# Patient Record
Sex: Male | Born: 1955 | Race: White | Hispanic: No | Smoking: Current every day smoker
Health system: Southern US, Community
[De-identification: ages and names within clinical notes are randomized; demographics above are authoritative.]

## PROBLEM LIST (undated history)

## (undated) DIAGNOSIS — J189 Pneumonia, unspecified organism: Secondary | ICD-10-CM

## (undated) DIAGNOSIS — J45909 Unspecified asthma, uncomplicated: Secondary | ICD-10-CM

## (undated) DIAGNOSIS — E119 Type 2 diabetes mellitus without complications: Secondary | ICD-10-CM

## (undated) DIAGNOSIS — I609 Nontraumatic subarachnoid hemorrhage, unspecified: Secondary | ICD-10-CM

---

## 1997-09-16 ENCOUNTER — Emergency Department (HOSPITAL_COMMUNITY): Admission: EM | Admit: 1997-09-16 | Discharge: 1997-09-16 | Payer: Self-pay | Admitting: Emergency Medicine

## 2005-05-12 ENCOUNTER — Emergency Department (HOSPITAL_COMMUNITY): Admission: EM | Admit: 2005-05-12 | Discharge: 2005-05-12 | Payer: Self-pay | Admitting: Emergency Medicine

## 2017-03-30 ENCOUNTER — Other Ambulatory Visit: Payer: Self-pay | Admitting: Family Medicine

## 2017-03-30 ENCOUNTER — Ambulatory Visit
Admission: RE | Admit: 2017-03-30 | Discharge: 2017-03-30 | Disposition: A | Discharge status: Home / Self Care | Payer: 59 | Source: Ambulatory Visit | Attending: Family Medicine | Admitting: Family Medicine

## 2017-03-30 ENCOUNTER — Other Ambulatory Visit: Payer: Self-pay

## 2017-03-30 DIAGNOSIS — R059 Cough, unspecified: Secondary | ICD-10-CM

## 2017-03-30 DIAGNOSIS — R05 Cough: Secondary | ICD-10-CM

## 2018-03-06 ENCOUNTER — Inpatient Hospital Stay (HOSPITAL_COMMUNITY)
Admission: EM | Admit: 2018-03-06 | Discharge: 2018-03-17 | DRG: 022 | Disposition: A | Discharge status: Home / Self Care | Payer: 59 | Attending: Neurosurgery | Admitting: Neurosurgery

## 2018-03-06 ENCOUNTER — Inpatient Hospital Stay (HOSPITAL_COMMUNITY): Payer: 59 | Admitting: Anesthesiology

## 2018-03-06 ENCOUNTER — Inpatient Hospital Stay (HOSPITAL_COMMUNITY): Payer: 59

## 2018-03-06 ENCOUNTER — Encounter (HOSPITAL_COMMUNITY)
Admission: EM | Disposition: A | Discharge status: Home / Self Care | Payer: Self-pay | Source: Home / Self Care | Attending: Neurosurgery

## 2018-03-06 ENCOUNTER — Encounter (HOSPITAL_COMMUNITY): Payer: Self-pay | Admitting: Certified Registered Nurse Anesthetist

## 2018-03-06 ENCOUNTER — Encounter (HOSPITAL_COMMUNITY): Payer: Self-pay | Admitting: *Deleted

## 2018-03-06 ENCOUNTER — Other Ambulatory Visit: Payer: Self-pay

## 2018-03-06 ENCOUNTER — Emergency Department (HOSPITAL_COMMUNITY): Payer: 59

## 2018-03-06 DIAGNOSIS — I609 Nontraumatic subarachnoid hemorrhage, unspecified: Secondary | ICD-10-CM | POA: Diagnosis not present

## 2018-03-06 DIAGNOSIS — Z880 Allergy status to penicillin: Secondary | ICD-10-CM | POA: Diagnosis not present

## 2018-03-06 DIAGNOSIS — F17200 Nicotine dependence, unspecified, uncomplicated: Secondary | ICD-10-CM | POA: Diagnosis present

## 2018-03-06 DIAGNOSIS — I671 Cerebral aneurysm, nonruptured: Secondary | ICD-10-CM | POA: Diagnosis not present

## 2018-03-06 DIAGNOSIS — Z7982 Long term (current) use of aspirin: Secondary | ICD-10-CM | POA: Diagnosis not present

## 2018-03-06 DIAGNOSIS — Z79899 Other long term (current) drug therapy: Secondary | ICD-10-CM | POA: Diagnosis not present

## 2018-03-06 HISTORY — PX: IR ANGIO INTRA EXTRACRAN SEL INTERNAL CAROTID BILAT MOD SED: IMG5363

## 2018-03-06 HISTORY — PX: IR TRANSCATH/EMBOLIZ: IMG695

## 2018-03-06 HISTORY — PX: IR ANGIOGRAM FOLLOW UP STUDY: IMG697

## 2018-03-06 HISTORY — PX: IR NEURO EACH ADD'L AFTER BASIC UNI LEFT (MS): IMG5373

## 2018-03-06 HISTORY — PX: IR ANGIO VERTEBRAL SEL VERTEBRAL BILAT MOD SED: IMG5369

## 2018-03-06 HISTORY — PX: RADIOLOGY WITH ANESTHESIA: SHX6223

## 2018-03-06 LAB — GLUCOSE, CAPILLARY
GLUCOSE-CAPILLARY: 147 mg/dL — AB (ref 70–99)
GLUCOSE-CAPILLARY: 128 mg/dL — AB (ref 70–99)
Glucose-Capillary: 176 mg/dL — ABNORMAL HIGH (ref 70–99)
Glucose-Capillary: 139 mg/dL — ABNORMAL HIGH (ref 70–99)

## 2018-03-06 LAB — TYPE AND SCREEN
ABO/RH(D): O POS
Antibody Screen: NEGATIVE

## 2018-03-06 LAB — I-STAT CHEM 8, ED
BUN: 15 mg/dL (ref 6–20)
CREATININE: 1 mg/dL (ref 0.61–1.24)
Calcium, Ion: 1.11 mmol/L — ABNORMAL LOW (ref 1.15–1.40)
Chloride: 102 mmol/L (ref 98–111)
GLUCOSE: 183 mg/dL — AB (ref 70–99)
HCT: 44 % (ref 39.0–52.0)
HEMOGLOBIN: 15 g/dL (ref 13.0–17.0)
Potassium: 4 mmol/L (ref 3.5–5.1)
Sodium: 140 mmol/L (ref 135–145)
TCO2: 29 mmol/L (ref 22–32)

## 2018-03-06 LAB — MRSA PCR SCREENING: MRSA by PCR: NEGATIVE

## 2018-03-06 LAB — CBC WITH DIFFERENTIAL/PLATELET
Abs Immature Granulocytes: 0.1 10*3/uL (ref 0.0–0.1)
BASOS ABS: 0.1 10*3/uL (ref 0.0–0.1)
Basophils Relative: 1 %
EOS PCT: 3 %
Eosinophils Absolute: 0.3 10*3/uL (ref 0.0–0.7)
HCT: 46.7 % (ref 39.0–52.0)
HEMOGLOBIN: 15.1 g/dL (ref 13.0–17.0)
Immature Granulocytes: 1 %
LYMPHS PCT: 29 %
Lymphs Abs: 2.9 10*3/uL (ref 0.7–4.0)
MCH: 30.3 pg (ref 26.0–34.0)
MCHC: 32.3 g/dL (ref 30.0–36.0)
MCV: 93.8 fL (ref 78.0–100.0)
MONO ABS: 0.7 10*3/uL (ref 0.1–1.0)
MONOS PCT: 7 %
Neutro Abs: 6.1 10*3/uL (ref 1.7–7.7)
Neutrophils Relative %: 59 %
Platelets: 243 10*3/uL (ref 150–400)
RBC: 4.98 MIL/uL (ref 4.22–5.81)
RDW: 12.6 % (ref 11.5–15.5)
WBC: 10.2 10*3/uL (ref 4.0–10.5)

## 2018-03-06 LAB — COMPREHENSIVE METABOLIC PANEL
ALBUMIN: 3.7 g/dL (ref 3.5–5.0)
ALK PHOS: 60 U/L (ref 38–126)
ALT: 17 U/L (ref 0–44)
ANION GAP: 10 (ref 5–15)
AST: 23 U/L (ref 15–41)
BILIRUBIN TOTAL: 0.5 mg/dL (ref 0.3–1.2)
BUN: 11 mg/dL (ref 6–20)
CALCIUM: 9.3 mg/dL (ref 8.9–10.3)
CO2: 25 mmol/L (ref 22–32)
Chloride: 104 mmol/L (ref 98–111)
Creatinine, Ser: 1.01 mg/dL (ref 0.61–1.24)
GFR calc non Af Amer: >60 mL/min (ref >60)
GLUCOSE: 173 mg/dL — AB (ref 70–99)
POTASSIUM: 3.4 mmol/L — AB (ref 3.5–5.1)
SODIUM: 139 mmol/L (ref 135–145)
TOTAL PROTEIN: 6 g/dL — AB (ref 6.5–8.1)

## 2018-03-06 LAB — CBC
HCT: 46.9 % (ref 39.0–52.0)
Hemoglobin: 14.8 g/dL (ref 13.0–17.0)
MCH: 30.4 pg (ref 26.0–34.0)
MCHC: 31.6 g/dL (ref 30.0–36.0)
MCV: 96.3 fL (ref 78.0–100.0)
Platelets: 206 10*3/uL (ref 150–400)
RBC: 4.87 MIL/uL (ref 4.22–5.81)
RDW: 12.6 % (ref 11.5–15.5)
WBC: 10.4 10*3/uL (ref 4.0–10.5)

## 2018-03-06 LAB — ABO/RH: ABO/RH(D): O POS

## 2018-03-06 LAB — PROTIME-INR
INR: 0.94
Prothrombin Time: 12.5 seconds (ref 11.4–15.2)

## 2018-03-06 LAB — APTT: aPTT: 27 s (ref 24–36)

## 2018-03-06 LAB — HIV ANTIBODY (ROUTINE TESTING W REFLEX): HIV Screen 4th Generation wRfx: NONREACTIVE

## 2018-03-06 SURGERY — CRANIOTOMY, FOR VERTEBRAL/BASILAR ARTERY ANEURYSM REPAIR
Anesthesia: Choice

## 2018-03-06 SURGERY — IR WITH ANESTHESIA
Anesthesia: General

## 2018-03-06 SURGERY — CRANIOTOMY INTRACRANIAL ANEURYSM FOR CAROTID
Anesthesia: General

## 2018-03-06 MED ORDER — PROCHLORPERAZINE EDISYLATE 10 MG/2ML IJ SOLN
10.0000 mg | Freq: Once | INTRAMUSCULAR | Status: AC
  Administered 2018-03-06: 10 mg via INTRAVENOUS
  Filled 2018-03-06: qty 2

## 2018-03-06 MED ORDER — LISINOPRIL 10 MG PO TABS
10.0000 mg | ORAL_TABLET | Freq: Every day | ORAL | Status: DC
  Administered 2018-03-06 – 2018-03-17 (×8): 10 mg via ORAL
  Filled 2018-03-06 (×10): qty 1

## 2018-03-06 MED ORDER — STROKE: EARLY STAGES OF RECOVERY BOOK
Freq: Once | Status: DC
  Filled 2018-03-06 (×2): qty 1

## 2018-03-06 MED ORDER — IOHEXOL 300 MG/ML  SOLN
150.0000 mL | Freq: Once | INTRAMUSCULAR | Status: AC | PRN
  Administered 2018-03-06: 60 mL via INTRA_ARTERIAL

## 2018-03-06 MED ORDER — IOPAMIDOL (ISOVUE-370) INJECTION 76%
INTRAVENOUS | Status: AC
  Filled 2018-03-06: qty 50

## 2018-03-06 MED ORDER — ORAL CARE MOUTH RINSE
15.0000 mL | Freq: Two times a day (BID) | OROMUCOSAL | Status: DC
  Administered 2018-03-06 – 2018-03-16 (×9): 15 mL via OROMUCOSAL

## 2018-03-06 MED ORDER — SODIUM CHLORIDE 0.9 % IV SOLN
INTRAVENOUS | Status: DC
  Administered 2018-03-06 – 2018-03-12 (×14): via INTRAVENOUS
  Administered 2018-03-13: 1000 mL via INTRAVENOUS
  Administered 2018-03-14 (×2): via INTRAVENOUS
  Administered 2018-03-14 – 2018-03-15 (×2): 1000 mL via INTRAVENOUS
  Administered 2018-03-16: 06:00:00 via INTRAVENOUS

## 2018-03-06 MED ORDER — ONDANSETRON HCL 4 MG/2ML IJ SOLN
4.0000 mg | Freq: Once | INTRAMUSCULAR | Status: AC
  Administered 2018-03-06: 4 mg via INTRAVENOUS
  Filled 2018-03-06: qty 2

## 2018-03-06 MED ORDER — CLINDAMYCIN HCL 300 MG PO CAPS
300.0000 mg | ORAL_CAPSULE | Freq: Four times a day (QID) | ORAL | Status: DC
  Administered 2018-03-06 – 2018-03-11 (×23): 300 mg via ORAL
  Filled 2018-03-06: qty 1
  Filled 2018-03-06: qty 2
  Filled 2018-03-06 (×7): qty 1
  Filled 2018-03-06: qty 2
  Filled 2018-03-06 (×13): qty 1
  Filled 2018-03-06: qty 2
  Filled 2018-03-06: qty 1

## 2018-03-06 MED ORDER — LEVETIRACETAM IN NACL 500 MG/100ML IV SOLN
500.0000 mg | Freq: Two times a day (BID) | INTRAVENOUS | Status: DC
  Administered 2018-03-06 – 2018-03-16 (×20): 500 mg via INTRAVENOUS
  Filled 2018-03-06 (×20): qty 100

## 2018-03-06 MED ORDER — SUGAMMADEX SODIUM 200 MG/2ML IV SOLN
INTRAVENOUS | Status: DC | PRN
  Administered 2018-03-06: 200 mg via INTRAVENOUS

## 2018-03-06 MED ORDER — PANTOPRAZOLE SODIUM 40 MG PO PACK: 40.0000 mg | PACK | Freq: Every day | ORAL | Status: DC

## 2018-03-06 MED ORDER — MIDAZOLAM HCL 2 MG/2ML IJ SOLN
INTRAMUSCULAR | Status: AC
  Filled 2018-03-06: qty 2

## 2018-03-06 MED ORDER — SODIUM CHLORIDE 0.9 % IV SOLN
INTRAVENOUS | Status: DC | PRN
  Administered 2018-03-06: 17:00:00 via INTRAVENOUS

## 2018-03-06 MED ORDER — NIMODIPINE 60 MG/20ML PO SOLN: 60.0000 mg | ORAL | Status: DC

## 2018-03-06 MED ORDER — MORPHINE SULFATE (PF) 2 MG/ML IV SOLN: 1.0000 mg | INTRAVENOUS | Status: DC | PRN

## 2018-03-06 MED ORDER — ROCURONIUM BROMIDE 10 MG/ML (PF) SYRINGE
PREFILLED_SYRINGE | INTRAVENOUS | Status: DC | PRN
  Administered 2018-03-06 (×2): 50 mg via INTRAVENOUS

## 2018-03-06 MED ORDER — LIDOCAINE 2% (20 MG/ML) 5 ML SYRINGE
INTRAMUSCULAR | Status: DC | PRN
  Administered 2018-03-06: 100 mg via INTRAVENOUS

## 2018-03-06 MED ORDER — FENTANYL CITRATE (PF) 250 MCG/5ML IJ SOLN
INTRAMUSCULAR | Status: AC
  Filled 2018-03-06: qty 5

## 2018-03-06 MED ORDER — IOPAMIDOL (ISOVUE-370) INJECTION 76%
50.0000 mL | Freq: Once | INTRAVENOUS | Status: AC | PRN
  Administered 2018-03-06: 50 mL via INTRAVENOUS

## 2018-03-06 MED ORDER — ACETAMINOPHEN 325 MG PO TABS
650.0000 mg | ORAL_TABLET | ORAL | Status: DC | PRN
  Administered 2018-03-08 – 2018-03-10 (×10): 650 mg via ORAL
  Filled 2018-03-06 (×10): qty 2

## 2018-03-06 MED ORDER — ONDANSETRON HCL 4 MG/2ML IJ SOLN: 4.0000 mg | Freq: Once | INTRAMUSCULAR | Status: DC | PRN

## 2018-03-06 MED ORDER — ONDANSETRON HCL 4 MG/2ML IJ SOLN
4.0000 mg | Freq: Four times a day (QID) | INTRAMUSCULAR | Status: DC | PRN
  Administered 2018-03-06 – 2018-03-07 (×2): 4 mg via INTRAVENOUS
  Filled 2018-03-06 (×2): qty 2

## 2018-03-06 MED ORDER — ACETAMINOPHEN-CODEINE #3 300-30 MG PO TABS
1.0000 | ORAL_TABLET | ORAL | Status: DC | PRN
  Administered 2018-03-06 – 2018-03-10 (×2): 1 via ORAL
  Administered 2018-03-10 (×2): 2 via ORAL
  Administered 2018-03-10: 1 via ORAL
  Administered 2018-03-11 – 2018-03-17 (×21): 2 via ORAL
  Filled 2018-03-06 (×3): qty 2
  Filled 2018-03-06: qty 1
  Filled 2018-03-06: qty 2
  Filled 2018-03-06: qty 1
  Filled 2018-03-06 (×7): qty 2
  Filled 2018-03-06: qty 1
  Filled 2018-03-06 (×7): qty 2
  Filled 2018-03-06: qty 1
  Filled 2018-03-06 (×5): qty 2
  Filled 2018-03-06: qty 1
  Filled 2018-03-06: qty 2

## 2018-03-06 MED ORDER — DOCUSATE SODIUM 100 MG PO CAPS
100.0000 mg | ORAL_CAPSULE | Freq: Two times a day (BID) | ORAL | Status: DC
  Administered 2018-03-06 – 2018-03-17 (×23): 100 mg via ORAL
  Filled 2018-03-06 (×23): qty 1

## 2018-03-06 MED ORDER — LIDOCAINE 2% (20 MG/ML) 5 ML SYRINGE: INTRAMUSCULAR | Status: DC | PRN

## 2018-03-06 MED ORDER — SODIUM CHLORIDE 0.9 % IV SOLN
INTRAVENOUS | Status: DC | PRN
  Administered 2018-03-06: 500 mL via INTRAVENOUS
  Administered 2018-03-07: 250 mL via INTRAVENOUS

## 2018-03-06 MED ORDER — REMIFENTANIL HCL 2 MG IV SOLR
INTRAVENOUS | Status: AC
  Filled 2018-03-06: qty 2000

## 2018-03-06 MED ORDER — SODIUM CHLORIDE 0.9 % IV SOLN
INTRAVENOUS | Status: DC | PRN
  Administered 2018-03-06: .2 ug/kg/min via INTRAVENOUS

## 2018-03-06 MED ORDER — PNEUMOCOCCAL VAC POLYVALENT 25 MCG/0.5ML IJ INJ
0.5000 mL | INJECTION | INTRAMUSCULAR | Status: DC
  Filled 2018-03-06 (×3): qty 0.5

## 2018-03-06 MED ORDER — INFLUENZA VAC SPLIT QUAD 0.5 ML IM SUSY
0.5000 mL | PREFILLED_SYRINGE | INTRAMUSCULAR | Status: DC
  Filled 2018-03-06 (×3): qty 0.5

## 2018-03-06 MED ORDER — LABETALOL HCL 5 MG/ML IV SOLN
20.0000 mg | Freq: Once | INTRAVENOUS | Status: AC
  Administered 2018-03-06: 20 mg via INTRAVENOUS
  Filled 2018-03-06: qty 4

## 2018-03-06 MED ORDER — FENTANYL CITRATE (PF) 100 MCG/2ML IJ SOLN: 25.0000 ug | INTRAMUSCULAR | Status: DC | PRN

## 2018-03-06 MED ORDER — ESMOLOL HCL 100 MG/10ML IV SOLN
INTRAVENOUS | Status: DC | PRN
  Administered 2018-03-06: 20 mg via INTRAVENOUS

## 2018-03-06 MED ORDER — PANTOPRAZOLE SODIUM 40 MG PO TBEC
40.0000 mg | DELAYED_RELEASE_TABLET | Freq: Every day | ORAL | Status: DC
  Administered 2018-03-06 – 2018-03-17 (×12): 40 mg via ORAL
  Filled 2018-03-06 (×12): qty 1

## 2018-03-06 MED ORDER — ACETAMINOPHEN 650 MG RE SUPP: 650.0000 mg | RECTAL | Status: DC | PRN

## 2018-03-06 MED ORDER — MORPHINE SULFATE (PF) 4 MG/ML IV SOLN
4.0000 mg | Freq: Once | INTRAVENOUS | Status: AC
  Administered 2018-03-06: 4 mg via INTRAVENOUS
  Filled 2018-03-06: qty 1

## 2018-03-06 MED ORDER — METFORMIN HCL ER 500 MG PO TB24
1000.0000 mg | ORAL_TABLET | Freq: Two times a day (BID) | ORAL | Status: DC
  Administered 2018-03-06 – 2018-03-17 (×18): 1000 mg via ORAL
  Filled 2018-03-06 (×24): qty 2

## 2018-03-06 MED ORDER — SUCCINYLCHOLINE CHLORIDE 200 MG/10ML IV SOSY
PREFILLED_SYRINGE | INTRAVENOUS | Status: DC | PRN
  Administered 2018-03-06: 120 mg via INTRAVENOUS

## 2018-03-06 MED ORDER — ACETAMINOPHEN 160 MG/5ML PO SOLN: 650.0000 mg | ORAL | Status: DC | PRN

## 2018-03-06 MED ORDER — DEXMEDETOMIDINE HCL IN NACL 200 MCG/50ML IV SOLN
INTRAVENOUS | Status: DC | PRN
  Administered 2018-03-06: .3 ug/kg/h via INTRAVENOUS

## 2018-03-06 MED ORDER — CLEVIDIPINE BUTYRATE 0.5 MG/ML IV EMUL
0.0000 mg/h | INTRAVENOUS | Status: DC
  Administered 2018-03-06: 2 mg/h via INTRAVENOUS
  Filled 2018-03-06 (×2): qty 50

## 2018-03-06 MED ORDER — PROPOFOL 10 MG/ML IV BOLUS
INTRAVENOUS | Status: DC | PRN
  Administered 2018-03-06: 150 mg via INTRAVENOUS

## 2018-03-06 MED ORDER — ONDANSETRON 4 MG PO TBDP: 4.0000 mg | ORAL_TABLET | Freq: Four times a day (QID) | ORAL | Status: DC | PRN

## 2018-03-06 MED ORDER — NIMODIPINE 30 MG PO CAPS
60.0000 mg | ORAL_CAPSULE | ORAL | Status: DC
  Administered 2018-03-06 – 2018-03-08 (×16): 60 mg via ORAL
  Administered 2018-03-09: 30 mg via ORAL
  Administered 2018-03-09 (×2): 60 mg via ORAL
  Filled 2018-03-06 (×19): qty 2

## 2018-03-06 MED ORDER — SODIUM CHLORIDE 0.9 % IV SOLN
INTRAVENOUS | Status: DC | PRN
  Administered 2018-03-06: 50 ug/min via INTRAVENOUS

## 2018-03-06 MED ORDER — SODIUM CHLORIDE 0.9 % IV SOLN
INTRAVENOUS | Status: AC
  Filled 2018-03-06: qty 100

## 2018-03-06 MED ORDER — OXYCODONE HCL 5 MG PO TABS: 5.0000 mg | ORAL_TABLET | ORAL | Status: DC | PRN

## 2018-03-06 NOTE — Anesthesia Preprocedure Evaluation (Addendum)
Anesthesia Evaluation  Patient identified by MRN, date of birth, ID band Patient awake    Reviewed: Allergy & Precautions, NPO status , Patient's Chart, lab work & pertinent test results  Airway Mallampati: II  TM Distance: >3 FB Neck ROM: Full    Dental  (+) Dental Advisory Given   Pulmonary neg pulmonary ROS,    breath sounds clear to auscultation       Cardiovascular negative cardio ROS   Rhythm:Regular Rate:Normal     Neuro/Psych  Headaches, ACOM aneurysm with SAH    GI/Hepatic negative GI ROS, Neg liver ROS,   Endo/Other  negative endocrine ROS  Renal/GU negative Renal ROS     Musculoskeletal   Abdominal   Peds  Hematology negative hematology ROS (+)   Anesthesia Other Findings   Reproductive/Obstetrics                             Lab Results  Component Value Date   WBC 10.4 03/06/2018   HGB 14.8 03/06/2018   HCT 46.9 03/06/2018   MCV 96.3 03/06/2018   PLT 206 03/06/2018   Lab Results  Component Value Date   CREATININE 1.00 03/06/2018   BUN 15 03/06/2018   NA 140 03/06/2018   K 4.0 03/06/2018   CL 102 03/06/2018   CO2 25 03/06/2018    Anesthesia Physical Anesthesia Plan  ASA: IV  Anesthesia Plan: General   Post-op Pain Management:    Induction: Intravenous  PONV Risk Score and Plan: 2 and Ondansetron, Dexamethasone and Treatment may vary due to age or medical condition  Airway Management Planned: Oral ETT  Additional Equipment: Arterial line  Intra-op Plan:   Post-operative Plan: Extubation in OR and Possible Post-op intubation/ventilation  Informed Consent: I have reviewed the patients History and Physical, chart, labs and discussed the procedure including the risks, benefits and alternatives for the proposed anesthesia with the patient or authorized representative who has indicated his/her understanding and acceptance.   Dental advisory given  Plan  Discussed with: CRNA  Anesthesia Plan Comments: (GETA + Remifentanil gtt.)        Anesthesia Quick Evaluation

## 2018-03-06 NOTE — Brief Op Note (Signed)
  NEUROSURGERY BRIEF OPERATIVE NOTE   PREOP DX: Subarachnoid Hemorrhage  POSTOP DX: Same  PROCEDURE: Diagnostic cerebral angiogram, Coil embolization of Acom aneurysm  SURGEON: Kerrigan Gombos  ANESTHESIA: GETA  EBL: Minimal  DRAINS: None  COMPLICATIONS: None immediate  CONDITION: Hemodynamically stable to PACU  FINDINGS:  1. 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

## 2018-03-06 NOTE — Progress Notes (Signed)
Pt verbally asked nurse to call his sister, Norville Haggard @ [phone], and notify her of his whereabouts and condition.    Pt verbally stated that he has no wife or children.

## 2018-03-06 NOTE — ED Provider Notes (Signed)
TIME SEEN: 2:20 AM  CHIEF COMPLAINT: Headache  HPI: Patient is a 62 year old male with history of hypertension, diabetes who presents the emergency department with sudden onset pounding diffuse headache that woke him from sleep at 1 AM.  Did have blurry vision, lightheadedness and 2 episodes of vomiting.  No history of head injury or on anticoagulants or antiplatelets.  No history of chronic headaches, migraines.  Denies numbness, tingling or focal weakness.  No vision loss.  No fever.  Has photophobia.  ROS: See HPI Constitutional: no fever  Eyes: no drainage  ENT: no runny nose   Cardiovascular:  no chest pain  Resp: no SOB  GI: no vomiting GU: no dysuria Integumentary: no rash  Allergy: no hives  Musculoskeletal: no leg swelling  Neurological: no slurred speech ROS otherwise negative  PAST MEDICAL HISTORY/PAST SURGICAL HISTORY:  No past medical history on file.  MEDICATIONS:  Prior to Admission medications   Not on File    ALLERGIES:  Allergies not on file  SOCIAL HISTORY:  Social History   Tobacco Use  . Smoking status: Not on file  Substance Use Topics  . Alcohol use: Not on file    FAMILY HISTORY: No family history on file.  EXAM: BP 140/68   Pulse 63   Temp 97.7 F (36.5 C) (Oral)   Resp 16   Ht 5\' 10"  (1.778 m)   Wt 103.4 kg   SpO2 94%   BMI 32.71 kg/m  CONSTITUTIONAL: Alert and oriented x3 and responds appropriately to questions.  Peers uncomfortable.  Has photophobia. HEAD: Normocephalic EYES: Conjunctivae clear, pupils appear equal, EOMI ENT: normal nose; moist mucous membranes NECK: Supple, no meningismus, no nuchal rigidity, no LAD  CARD: RRR; S1 and S2 appreciated; no murmurs, no clicks, no rubs, no gallops RESP: Normal chest excursion without splinting or tachypnea; breath sounds clear and equal bilaterally; no wheezes, no rhonchi, no rales, no hypoxia or respiratory distress, speaking full sentences ABD/GI: Normal bowel sounds;  non-distended; soft, non-tender, no rebound, no guarding, no peritoneal signs, no hepatosplenomegaly BACK:  The back appears normal and is non-tender to palpation, there is no CVA tenderness EXT: Normal ROM in all joints; non-tender to palpation; no edema; normal capillary refill; no cyanosis, no calf tenderness or swelling    SKIN: Normal color for age and race; warm; no rash NEURO: Moves all extremities equally, strength 5/5 in all 4 extremities, sensation to light touch intact diffusely, cranial nerves II through XII intact, no neglect, normal speech, normal visual fields PSYCH: The patient's mood and manner are appropriate. Grooming and personal hygiene are appropriate.  MEDICAL DECISION MAKING: Pt here with sudden onset headache with blurry vision and lightheadedness.  No focal neurologic deficits on exam.  Will obtain CTA of the head.  Labs ordered.  [place] includes intracranial hemorrhage.  Less likely stroke or meningitis.  Migraines also on the differential.  Will start with IV Compazine for pain relief.  ED PROGRESS: 4:05 AM  CT scan shows a patient has a ruptured aneurysm with moderate amount of subarachnoid bleed.  Discussed with neurosurgery on-call.  Will give morphine and Zofran for symptomatic relief.  Patient updated with plan.  4:13 AM  D/w Kathryne Eriksson, PA on call for NSG.  He will see patient in the emergency department.  Will admit.  Patient's blood pressures in the 140s/70s.  CRITICAL CARE Performed by: Rochele Raring   Total critical care time: 45 minutes  Critical care time was exclusive of separately billable  procedures and treating other patients.  Critical care was necessary to treat or prevent imminent or life-threatening deterioration.  Critical care was time spent personally by me on the following activities: development of treatment plan with patient and/or surrogate as well as nursing, discussions with consultants, evaluation of patient's response to treatment,  examination of patient, obtaining history from patient or surrogate, ordering and performing treatments and interventions, ordering and review of laboratory studies, ordering and review of radiographic studies, pulse oximetry and re-evaluation of patient's condition.      Kerri Kovacik, Layla Maw, DO 03/06/18 [phone]

## 2018-03-06 NOTE — Anesthesia Procedure Notes (Signed)
Procedure Name: Intubation Date/Time: 03/06/2018 4:20 PM Performed by: Dairl Ponder, CRNA Pre-anesthesia Checklist: Patient identified, Emergency Drugs available, Suction available, Patient being monitored and Timeout performed Patient Re-evaluated:Patient Re-evaluated prior to induction Oxygen Delivery Method: Circle system utilized Preoxygenation: Pre-oxygenation with 100% oxygen Induction Type: IV induction Ventilation: Mask ventilation without difficulty and Oral airway inserted - appropriate to patient size Laryngoscope Size: McGraph and 4 Grade View: Grade I Tube type: Subglottic suction tube Tube size: 7.5 mm Number of attempts: 1 Airway Equipment and Method: Stylet Placement Confirmation: ETT inserted through vocal cords under direct vision,  positive ETCO2 and breath sounds checked- equal and bilateral Secured at: 23 cm Tube secured with: Tape Dental Injury: Teeth and Oropharynx as per pre-operative assessment

## 2018-03-06 NOTE — Progress Notes (Signed)
SLP Cancellation Note  Patient Details Name: [name] MRN: [id number] DOB: 01/30/1956   Cancelled treatment:       Reason Eval/Treat Not Completed: Patient not medically ready. Pt NPO for procedure this afternoon. Will follow up next date.  Rondel Baton, [place], CCC-SLP Speech-Language Pathologist Acute Rehabilitation Services Pager: [phone] Office: [phone]    Arlana Lindau 03/06/2018, 1:33 PM

## 2018-03-06 NOTE — Anesthesia Procedure Notes (Signed)
Arterial Line Insertion Start/End9/28/2019 3:40 PM, 03/06/2018 3:53 PM Performed by: Adair Laundry, CRNA, CRNA  Patient location: Pre-op. Preanesthetic checklist: patient identified, IV checked, risks and benefits discussed, surgical consent and monitors and equipment checked Lidocaine 1% used for infiltration Right, radial was placed Catheter size: 20 G Hand hygiene performed , maximum sterile barriers used  and Seldinger technique used  Attempts: 1 Procedure performed without using ultrasound guided technique. Following insertion, dressing applied and Biopatch. Post procedure assessment: normal  Patient tolerated the procedure well with no immediate complications.

## 2018-03-06 NOTE — Progress Notes (Signed)
Per PACU report, Dr. Conchita Paris gave verbal instructions to keep SBP<160

## 2018-03-06 NOTE — Progress Notes (Signed)
  NEUROSURGERY PROGRESS NOTE   Pt seen in PACU. Drowsy but arousable. Face symmetric, Follows commands with good strength BUE/BLE.

## 2018-03-06 NOTE — Progress Notes (Signed)
Rt femoral arterial line has blood backing up in tubing. Possible leak in a connection. Discussed with Victorino Dike RN on adm to [phone] and resp. Therapy called to inspect.

## 2018-03-06 NOTE — Progress Notes (Signed)
PT Cancellation Note  Patient Details Name: Alfred Marshall MRN: [id number] DOB: 1956/04/21   Cancelled Treatment:    Reason Eval/Treat Not Completed: Patient not medically ready.  RN asked to hold therapies today. 03/06/2018  [place] Bing, PT Acute Rehabilitation Services [phone]  (pager) [phone]  (office)   Eliseo Gum Trish Mancinelli 03/06/2018, 2:11 PM

## 2018-03-06 NOTE — ED Triage Notes (Signed)
Pt arrives from home, awoken from sleep by headache with dizziness and blurred vision. No neuro hx. No hx HTN but initally 190/110 for GEMS, down to 140/80.  Had episode of n/v releived by 4 zofran for GEMS, diaphoretic PTA Alert oriented x4 on arrival, No CP, being treated for abscess but endorses improvement  18 LAC CBG 140's

## 2018-03-06 NOTE — Progress Notes (Signed)
Left Femoral Arterial Line leaking and blood back up in tubing. When replaced with a new line, the catheter would not flush. Attempted to draw back blood, but the line was occluded. Art line removed with catheter intact. Occlusive dressing applied.

## 2018-03-06 NOTE — Transfer of Care (Signed)
Immediate Anesthesia Transfer of Care Note  Patient: Alfred Marshall  Procedure(s) Performed: IR WITH ANESTHESIA:ANGIOGRAM POSSIBLE ANEURYSM COILING (N/A )  Patient Location: PACU  Anesthesia Type:General  Level of Consciousness: awake  Airway & Oxygen Therapy: Patient Spontanous Breathing  Post-op Assessment: Report given to RN and Post -op Vital signs reviewed and stable  Post vital signs: Reviewed and stable  Last Vitals:  Vitals Value Taken Time  BP 99/55 03/06/2018  7:16 PM  Temp    Pulse 65 03/06/2018  7:29 PM  Resp 14 03/06/2018  7:29 PM  SpO2 97 % 03/06/2018  7:29 PM  Vitals shown include unvalidated device data.  Last Pain:  Vitals:   03/06/18 1200  TempSrc: Oral  PainSc:          Complications: No apparent anesthesia complications

## 2018-03-06 NOTE — Sedation Documentation (Signed)
7 Fr. Exoseal to right groin 

## 2018-03-06 NOTE — H&P (Signed)
Chief Complaint   Chief Complaint  Patient presents with  . Headache  . Dizziness    HPI   HPI: Alfred Marshall is a 62 y.o. male who came to ER after he had an acute onset 10/10 HA that awoke him from sleep. Associated with dizziness, blurry vision and nausea. Since arrival to ER, headache has improved significantly although still present. He currently denies nausea, vomiting, changes in vision, weakness, focal deficit. Smoker for >40 years. No FHx of intracranial aneurysms. Takes ASA 86m daily for preventative purposes although does not take regularly.    Patient Active Problem List   Diagnosis Date Noted  . Subarachnoid hemorrhage ([facility] 03/06/2018    PMH: No past medical history on file.  PSH:  (Not in a hospital admission)  SH: Social History   Tobacco Use  . Smoking status: Not on file  Substance Use Topics  . Alcohol use: Not on file  . Drug use: Not on file    MEDS: Prior to Admission medications   Medication Sig Start Date End Date Taking? Authorizing Provider  aspirin EC 81 MG tablet Take 81 mg by mouth daily.   Yes [provider]  clindamycin (CLEOCIN) 300 MG capsule Take 300 mg by mouth 4 (four) times daily.   Yes [provider]  lisinopril (PRINIVIL,ZESTRIL) 10 MG tablet Take 10 mg by mouth daily.   Yes [provider]  metFORMIN (GLUCOPHAGE-XR) 500 MG 24 hr tablet Take 1,000 mg by mouth 2 (two) times daily.   Yes [provider]    ALLERGY: Allergies  Allergen Reactions  . Penicillins Other (See Comments)    Childhood allergy     Social History   Tobacco Use  . Smoking status: Not on file  Substance Use Topics  . Alcohol use: Not on file     No family history on file.   ROS   ROS  Exam   Vitals:   03/06/18 0208 03/06/18 0232  BP: 140/68 (!) 142/76  Pulse: 63 65  Resp: 16 (!) 21  Temp: 97.7 F (36.5 C) 97.9 F (36.6 C)  SpO2: 94% 90%   General appearance: WDWN, laying on stretcher with  eyes closed Eyes: PERRL, Fundoscopic: normal Cardiovascular: Regular rate and rhythm without murmurs, rubs, gallops. No edema or variciosities. Distal pulses normal. Pulmonary: Clear to auscultation Musculoskeletal:     Muscle tone upper extremities: Normal    Muscle tone lower extremities: Normal    Motor exam: Upper Extremities Deltoid Bicep Tricep Grip  Right 5/5 5/5 5/5 5/5  Left 5/5 5/5 5/5 5/5   Lower Extremity IP Quad PF DF EHL  Right 5/5 5/5 5/5 5/5 5/5  Left 5/5 5/5 5/5 5/5 5/5   Neurological Awake, alert, oriented Memory and concentration grossly intact Speech fluent, appropriate CNII: Visual fields normal CNIII/IV/VI: EOMI CNV: Facial sensation normal CNVII: Symmetric, normal strength CNVIII: Grossly normal CNIX: Normal palate movement CNXI: Trap and SCM strength normal CN XII: Tongue protrusion normal Sensation grossly intact to LT DTR: Normal Coordination (finger/nose & heel/shin): Normal  Results - Imaging/Labs   Results for orders placed or performed during the hospital encounter of 03/06/18 (from the past 48 hour(s))  CBC with Differential     Status: None   Collection Time: 03/06/18  2:22 AM  Result Value Ref Range   WBC 10.2 4.0 - 10.5 K/uL   RBC 4.98 4.22 - 5.81 MIL/uL   Hemoglobin 15.1 13.0 - 17.0 g/dL   HCT 46.7 39.0 -  52.0 %   MCV 93.8 78.0 - 100.0 fL   MCH 30.3 26.0 - 34.0 pg   MCHC 32.3 30.0 - 36.0 g/dL   RDW 12.6 11.5 - 15.5 %   Platelets 243 150 - 400 K/uL   Neutrophils Relative % 59 %   Neutro Abs 6.1 1.7 - 7.7 K/uL   Lymphocytes Relative 29 %   Lymphs Abs 2.9 0.7 - 4.0 K/uL   Monocytes Relative 7 %   Monocytes Absolute 0.7 0.1 - 1.0 K/uL   Eosinophils Relative 3 %   Eosinophils Absolute 0.3 0.0 - 0.7 K/uL   Basophils Relative 1 %   Basophils Absolute 0.1 0.0 - 0.1 K/uL   Immature Granulocytes 1 %   Abs Immature Granulocytes 0.1 0.0 - 0.1 K/uL    Comment: Performed at [facility] [address], [place], [place] [address]   Comprehensive metabolic panel     Status: Abnormal   Collection Time: 03/06/18  2:22 AM  Result Value Ref Range   Sodium 139 135 - 145 mmol/L   Potassium 3.4 (L) 3.5 - 5.1 mmol/L    Comment: SLIGHT HEMOLYSIS   Chloride 104 98 - 111 mmol/L   CO2 25 22 - 32 mmol/L   Glucose, Bld 173 (H) 70 - 99 mg/dL   BUN 11 6 - 20 mg/dL   Creatinine, Ser 1.01 0.61 - 1.24 mg/dL   Calcium 9.3 8.9 - 10.3 mg/dL   Total Protein 6.0 (L) 6.5 - 8.1 g/dL   Albumin 3.7 3.5 - 5.0 g/dL   AST 23 15 - 41 U/L   ALT 17 0 - 44 U/L   Alkaline Phosphatase 60 38 - 126 U/L   Total Bilirubin 0.5 0.3 - 1.2 mg/dL   GFR calc non Af Amer >60 >60 mL/min   GFR calc Af Amer >60 >60 mL/min    Comment: (NOTE) The eGFR has been calculated using the CKD EPI equation. This calculation has not been validated in all clinical situations. eGFR's persistently <60 mL/min signify possible Chronic Kidney Disease.    Anion gap 10 5 - 15    Comment: Performed at [facility] [address] [place], [place] [address]  I-stat chem 8, ed     Status: Abnormal   Collection Time: 03/06/18  2:50 AM  Result Value Ref Range   Sodium 140 135 - 145 mmol/L   Potassium 4.0 3.5 - 5.1 mmol/L   Chloride 102 98 - 111 mmol/L   BUN 15 6 - 20 mg/dL   Creatinine, Ser 1.00 0.61 - 1.24 mg/dL   Glucose, Bld 183 (H) 70 - 99 mg/dL   Calcium, Ion 1.11 (L) 1.15 - 1.40 mmol/L   TCO2 29 22 - 32 mmol/L   Hemoglobin 15.0 13.0 - 17.0 g/dL   HCT 44.0 39.0 - 52.0 %    Ct Angio Head W Or Wo Contrast  Result Date: 03/06/2018 CLINICAL DATA:  Acute onset worst headache of life. EXAM: CT ANGIOGRAPHY HEAD TECHNIQUE: Multidetector CT imaging of the head was performed using the standard protocol during bolus administration of intravenous contrast. Multiplanar CT image reconstructions and MIPs were obtained to evaluate the vascular anatomy. CONTRAST:  55m ISOVUE-370 IOPAMIDOL (ISOVUE-370) INJECTION 76% COMPARISON:  None. FINDINGS: CT HEAD BRAIN: No  intraparenchymal hemorrhage, mass effect nor midline shift. The ventricles and sulci are normal. No acute large vascular territory infarcts. Moderate to large volume subarachnoid hemorrhage suprasellar cistern, basal cisterns standing to sylvian fissures and along  the anterior interhemispheric fissure. Cisterns are patent. VASCULAR: Unremarkable. SKULL/SOFT TISSUES: No skull fracture. No significant soft tissue swelling. ORBITS/SINUSES: The included ocular globes and orbital contents are normal.Trace paranasal sinus mucosal thickening. Mastoid air cells are well aerated. OTHER: None. CTA HEAD ANTERIOR CIRCULATION: Patent cervical internal carotid arteries, petrous, cavernous and supra clinoid internal carotid arteries. Patent anterior communicating artery. 7 x 4 mm bilobed aneurysm arising from LEFT A1 2 junction with medially directed nipple sign. Patent anterior and middle cerebral arteries. No large vessel occlusion, significant stenosis, contrast extravasation or aneurysm. POSTERIOR CIRCULATION: Patent vertebral arteries, vertebrobasilar junction and basilar artery, as well as main branch vessels. Patent posterior cerebral arteries, RIGHT fetal origin. No large vessel occlusion, significant stenosis, contrast extravasation or aneurysm. VENOUS SINUSES: Major dural venous sinuses are patent though not tailored for evaluation on this angiographic examination. ANATOMIC VARIANTS: None. DELAYED PHASE: No suspicious intracranial enhancement. MIP images reviewed. IMPRESSION: CT HEAD: 1. Moderate to large volume subarachnoid hemorrhage compatible with ruptured aneurysm. CTA HEAD: 1. 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 Critical Value/emergent results were called by telephone at the time of interpretation on 03/06/2018 at 3:58 am to Dr. Pryor Curia , who verbally acknowledged these results. Electronically Signed   By: Elon Alas M.D.   On: 03/06/2018 04:04   Impression/Plan   63 y.o. male with mod-large  volume SAH secondary to ruptures ACOM aneurysm. [place] 2. No signs of hydrocephalus on CT. He is neurologically intact although is fatigued which is likely from being up all night. Will admit to neuro ICU for monitoring and eventual treatment. - He will need to undergo possible coiling vs clipping of ACOM aneurysm - Started cleviprex. BP Goal <140/90 - Nimotop for vasospasm prevention - Monitor neuro exam q 1 hour. Report any change

## 2018-03-07 ENCOUNTER — Encounter (HOSPITAL_COMMUNITY): Payer: Self-pay | Admitting: *Deleted

## 2018-03-07 LAB — GLUCOSE, CAPILLARY
Glucose-Capillary: 143 mg/dL — ABNORMAL HIGH (ref 70–99)
Glucose-Capillary: 152 mg/dL — ABNORMAL HIGH (ref 70–99)
Glucose-Capillary: 121 mg/dL — ABNORMAL HIGH (ref 70–99)
Glucose-Capillary: 120 mg/dL — ABNORMAL HIGH (ref 70–99)

## 2018-03-07 NOTE — Progress Notes (Signed)
SLP Cancellation Note  Patient Details Name: Alfred Marshall MRN: [id number] DOB: 05-29-1956   Cancelled treatment:       Reason Eval/Treat Not Completed: Fatigue/lethargy limiting ability to participate. Pt falling asleep in chair, unable to maintain arousal for cognitive-linguistic assessment. Will follow up.  Rondel Baton, [place], CCC-SLP Speech-Language Pathologist Acute Rehabilitation Services Pager: [phone] Office: [phone]    Arlana Lindau 03/07/2018, 11:08 AM

## 2018-03-07 NOTE — Evaluation (Signed)
Physical Therapy Evaluation Patient Details Name: Alfred Marshall MRN: [id number] DOB: 06/08/1956 Today's Date: 03/07/2018   History of Present Illness  Alfred Marshall is a 62 y.o. male who came to ER after he had an acute onset 10/10 HA that awoke him from sleep. Associated with dizziness, blurry vision and nausea. 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 Pt underwent coil embolization of aneurysm.  Clinical Impression  Pt with nausea and vomitting with just rolling in the bed. Once pt vomited pt was able to tolerate sitting EOB and amb 10' to chair. Pt maintained eyes closed to help with the dizziness. Anticipate pt to progress well. Pt works for the [employer] and has a lot of support despite living alone. Acute PT to cont to follow.    Follow Up Recommendations Home health PT;Supervision/Assistance - 24 hour(24/7 initially)    Equipment Recommendations  None recommended by PT    Recommendations for Other Services       Precautions / Restrictions Precautions Precautions: Fall Precaution Comments: very nauseated Restrictions Weight Bearing Restrictions: No      Mobility  Bed Mobility Overal bed mobility: Needs Assistance Bed Mobility: Rolling;Sidelying to Sit Rolling: Supervision Sidelying to sit: Min guard       General bed mobility comments: pt quick to move despite verbal cues to slow down.   Transfers Overall transfer level: Needs assistance Equipment used: None Transfers: Sit to/from Stand Sit to Stand: Min assist;+2 physical assistance(for safety due to nausea/dizziness with first time getting u)         General transfer comment: pt with good strength, mildly unsteady due to nausea/dizziness  Ambulation/Gait Ambulation/Gait assistance: Min assist;+2 physical assistance Gait Distance (Feet): 10 Feet(around EOB to other side of the bed) Assistive device: 2 person hand held assist Gait Pattern/deviations: Step-through  pattern;Decreased stride length Gait velocity: slow Gait velocity interpretation: <1.31 ft/sec, indicative of household ambulator General Gait Details: pt slow and guarded due to dizziness, pt maintained eyes closed to help minimize dizziness  Stairs            Wheelchair Mobility    Modified Rankin (Stroke Patients Only)       Balance Overall balance assessment: Needs assistance Sitting-balance support: Feet supported;Bilateral upper extremity supported Sitting balance-Leahy Scale: Fair Sitting balance - Comments: pt dependent on UEs due to feeling of dizziness   Standing balance support: Bilateral upper extremity supported Standing balance-Leahy Scale: Fair Standing balance comment: bilat UE support due to dizziness                             Pertinent Vitals/Pain Pain Assessment: 0-10 Pain Score: 8  Pain Location: headache Pain Descriptors / Indicators: Headache Pain Intervention(s): Monitored during session    Home Living Family/patient expects to be discharged to:: Private residence Living Arrangements: Alone Available Help at Discharge: Family;Friend(s);Available PRN/intermittently Type of Home: House Home Access: Level entry     Home Layout: 1/2 bath on main level        Prior Function Level of Independence: Independent         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[employer], therapeutic Dominance   Dominant Hand: Right    Extremity/Trunk Assessment   Upper Extremity Assessment Upper Extremity Assessment: Overall WFL for tasks assessed    Lower Extremity Assessment Lower Extremity Assessment: Overall WFL for tasks assessed    Cervical / Trunk Assessment Cervical / Trunk  Assessment: Normal(however reports back pain)  Communication   Communication: No difficulties  Cognition Arousal/Alertness: Awake/alert(but easily falls asleep) Behavior During Therapy: WFL for tasks assessed/performed Overall Cognitive Status: Within  Functional Limits for tasks assessed                                        General Comments General comments (skin integrity, edema, etc.): pt with c/o mid L sided thoracic pain, no bruising noted. provided with hot pack to help  with pain as patient doesn't want pain medicines    Exercises     Assessment/Plan    PT Assessment Patient needs continued PT services  PT Problem List Decreased strength;Decreased activity tolerance;Decreased balance;Decreased mobility;Decreased coordination       PT Treatment Interventions DME instruction;Gait training;Stair training;Functional mobility training;Therapeutic activities;Therapeutic exercise;Balance training;Neuromuscular re-education    PT Goals (Current goals can be found in the Care Plan section)  Acute Rehab PT Goals Patient Stated Goal: stop the headache PT Goal Formulation: With patient Time For Goal Achievement: 03/21/18 Potential to Achieve Goals: Good Additional Goals Additional Goal #1: Pt to score >19 on DGI to indicate minimal fall risk.    Frequency Min 4X/week   Barriers to discharge Decreased caregiver support lives alone    Co-evaluation               AM-PAC PT "6 Clicks" Daily Activity  Outcome Measure Difficulty turning over in bed (including adjusting bedclothes, sheets and blankets)?: None Difficulty moving from lying on back to sitting on the side of the bed? : None Difficulty sitting down on and standing up from a chair with arms (e.g., wheelchair, bedside commode, etc,.)?: Unable Help needed moving to and from a bed to chair (including a wheelchair)?: A Little Help needed walking in hospital room?: A Lot Help needed climbing 3-5 steps with a railing? : A Lot 6 Click Score: 16    End of Session Equipment Utilized During Treatment: Gait belt Activity Tolerance: Patient limited by pain;Patient limited by fatigue Patient left: in chair;with call bell/phone within reach;with  family/visitor present Nurse Communication: Mobility status(need for hotpack) PT Visit Diagnosis: Unsteadiness on feet (R26.81)    Time: 0981-1914 PT Time Calculation (min) (ACUTE ONLY): 30 min   Charges:   PT Evaluation $PT Eval Moderate Complexity: 1 Mod PT Treatments $Gait Training: 8-22 mins        Lewis Shock, PT, DPT Acute Rehabilitation Services Pager #: [phone] Office #: [phone]   Iona Hansen 03/07/2018, 11:15 AM

## 2018-03-07 NOTE — Anesthesia Postprocedure Evaluation (Signed)
Anesthesia Post Note  Patient: Alfred Marshall  Procedure(s) Performed: IR WITH ANESTHESIA:ANGIOGRAM POSSIBLE ANEURYSM COILING (N/A )     Patient location during evaluation: PACU Anesthesia Type: General Level of consciousness: awake and alert Pain management: pain level controlled Vital Signs Assessment: post-procedure vital signs reviewed and stable Respiratory status: spontaneous breathing, nonlabored ventilation, respiratory function stable and patient connected to nasal cannula oxygen Cardiovascular status: blood pressure returned to baseline and stable Postop Assessment: no apparent nausea or vomiting Anesthetic complications: no    Last Vitals:  Vitals:   03/07/18 0300 03/07/18 0400  BP: (!) 92/54   Pulse: (!) 58   Resp: 15   Temp:  36.9 C  SpO2: 95%     Last Pain:  Vitals:   03/07/18 0400  TempSrc: Oral  PainSc:                  Kennieth Rad

## 2018-03-07 NOTE — Progress Notes (Signed)
OT Cancellation Note  Patient Details Name: Abram Sax MRN: [id number] DOB: 1955/07/08   Cancelled Treatment:    Reason Eval/Treat Not Completed: Active bedrest order(Will return as schedule allows. Thank you.)  Hennesy Sobalvarro M Aundrea Horace Andilynn Delavega MSOT, OTR/L Acute Rehab Pager: [phone] Office: [phone] 03/07/2018, 7:06 AM

## 2018-03-07 NOTE — Progress Notes (Signed)
  NEUROSURGERY PROGRESS NOTE   No issues overnight. Cont to be nauseated, HA is ok but worsened when he vomits.  EXAM:  BP 100/63   Pulse (!) 47   Temp 99.1 F (37.3 C) (Oral)   Resp 13   Ht 5\' 11"  (1.803 m)   Wt 103.5 kg   SpO2 97%   BMI 31.82 kg/m   Awake, alert, oriented  Speech fluent, appropriate  CN grossly intact  5/5 BUE/BLE   IMPRESSION:  63 y.o. male SAH d#2 s/p coiling Acom aneurysm, neurologically intact  PLAN: - Cont supportive care - OOB as tolerated - Cont Nimotop, TCD tomorrow

## 2018-03-07 NOTE — Progress Notes (Deleted)
Dr. Corliss Skains paged regarding blood gas results. Reported that patient is still experiencing tachycardia and tachypnea. Verbal instructions to page anesthesia to intubate. Will place new orders for sedation and OGT.

## 2018-03-08 ENCOUNTER — Other Ambulatory Visit (HOSPITAL_COMMUNITY): Payer: Self-pay

## 2018-03-08 ENCOUNTER — Inpatient Hospital Stay (HOSPITAL_COMMUNITY): Payer: 59

## 2018-03-08 ENCOUNTER — Encounter (HOSPITAL_COMMUNITY): Payer: Self-pay | Admitting: Radiology

## 2018-03-08 DIAGNOSIS — I609 Nontraumatic subarachnoid hemorrhage, unspecified: Secondary | ICD-10-CM

## 2018-03-08 LAB — GLUCOSE, CAPILLARY
GLUCOSE-CAPILLARY: 99 mg/dL (ref 70–99)
Glucose-Capillary: 112 mg/dL — ABNORMAL HIGH (ref 70–99)
Glucose-Capillary: 123 mg/dL — ABNORMAL HIGH (ref 70–99)
Glucose-Capillary: 121 mg/dL — ABNORMAL HIGH (ref 70–99)

## 2018-03-08 NOTE — Evaluation (Signed)
Speech Language Pathology Evaluation Patient Details Name: Alfred Marshall MRN: [id number] DOB: 14-Sep-1955 Today's Date: 03/08/2018 Time: 7829-5621 SLP Time Calculation (min) (ACUTE ONLY): 20 min  Problem List:  Patient Active Problem List   Diagnosis Date Noted  . Subarachnoid hemorrhage (HCC) 03/06/2018   Past Medical History: No past medical history on file. 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 50 pk-yr smoking hx, no HTN, no family Hx of aneurysms. CTA demonstrates bilobed Acom aneurysm, now s/p coiling.   Assessment / Plan / Recommendation Clinical Impression  Pt presents with initial deficits in higher level cognition such semi-complex problem solving, selective attention and memory (retrieval of information). Pt is very sensitive to light and is drowsy (not currently on any sedating medication). Lights were kept off during evaluation. Would recommend skilled ST services to target higher level cognitive deficits in setting of visual/sensitivity deficits. Pt was independent prior to admission and requires skilled treatment to increase functional independence and provide ongoing cognitive assessment as complicating issues resolve. Suspect that HHST with 24 hour supervision is appropriate at discharge.     SLP Assessment  SLP Recommendation/Assessment: Patient needs continued Speech Lanaguage Pathology Services SLP Visit Diagnosis: Cognitive communication deficit (R41.841)    Follow Up Recommendations  Home health SLP    Frequency and Duration min 2x/week  2 weeks      SLP Evaluation Cognition  Overall Cognitive Status: Difficult to assess Arousal/Alertness: Awake/alert Orientation Level: Oriented X4 Attention: Sustained Sustained Attention: Appears intact Memory: Appears intact Awareness: Appears intact Problem Solving: Impaired Problem Solving Impairment: Verbal complex;Functional complex Behaviors: (fatigue and sensitivity to light,  kept eyes closed) Safety/Judgment: Appears intact       Comprehension  Auditory Comprehension Overall Auditory Comprehension: Appears within functional limits for tasks assessed Visual Recognition/Discrimination Discrimination: Not tested Reading Comprehension Reading Status: Not tested    Expression Expression Primary Mode of Expression: Verbal Verbal Expression Overall Verbal Expression: Appears within functional limits for tasks assessed Written Expression Dominant Hand: Right Written Expression: Not tested   Oral / Motor  Oral Motor/Sensory Function Overall Oral Motor/Sensory Function: Within functional limits Motor Speech Overall Motor Speech: Appears within functional limits for tasks assessed Respiration: Within functional limits Phonation: Normal Resonance: Within functional limits Articulation: Within functional limitis Intelligibility: Intelligible Motor Planning: Witnin functional limits Motor Speech Errors: Not applicable   GO                    Bela Bonaparte 03/08/2018, 11:07 AM

## 2018-03-08 NOTE — Progress Notes (Signed)
Occupational Therapy Evaluation Patient Details Name: Alfred Marshall MRN: [id number] DOB: February 24, 1956 Today's Date: 03/08/2018    History of Present Illness [name] is a 62 y.o. male who came to ER after he had an acute onset 10/10 HA that awoke him from sleep. Associated with dizziness, blurry vision and nausea. 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 Pt underwent coil embolization of aneurysm.   Clinical Impression   PTA, pt lived alone and worked for the [employer]. Pt experiencing apparent photophobia, however, nausea has improved. Pt overall set up/S with ADL and min guard A with mobility. Will follow acutely to facilitae safe DC home. At this time recommend HHOT.     Follow Up Recommendations  Home health OT;Supervision/Assistance - 24 hour(initially)    Equipment Recommendations  None recommended by OT    Recommendations for Other Services       Precautions / Restrictions Precautions Precautions: Fall      Mobility Bed Mobility Overal bed mobility: Modified Independent                Transfers Overall transfer level: Needs assistance   Transfers: Sit to/from Stand Sit to Stand: Min guard              Balance Overall balance assessment: Needs assistance   Sitting balance-Leahy Scale: Good       Standing balance-Leahy Scale: Fair                             ADL either performed or assessed with clinical judgement   ADL Overall ADL's : Needs assistance/impaired                                     Functional mobility during ADLs: Min guard General ADL Comments: Overall set up/S for ADL tasks     Vision Baseline Vision/History: Wears glasses Wears Glasses: At all times Patient Visual Report: No change from baseline Additional Comments: photophobia; able to read without difficulty     Perception Perception Comments: [facility]   Praxis Praxis Praxis tested?: Within  functional limits    Pertinent Vitals/Pain Pain Assessment: 0-10 Pain Score: 2  Pain Location: headache Pain Descriptors / Indicators: Headache Pain Intervention(s): Limited activity within patient's tolerance     Hand Dominance Right   Extremity/Trunk Assessment Upper Extremity Assessment Upper Extremity Assessment: Overall WFL for tasks assessed   Lower Extremity Assessment Lower Extremity Assessment: Overall WFL for tasks assessed   Cervical / Trunk Assessment Cervical / Trunk Assessment: Normal   Communication Communication Communication: No difficulties   Cognition Arousal/Alertness: Awake/alert Behavior During Therapy: WFL for tasks assessed/performed Overall Cognitive Status: Difficult to assess                                 General Comments: Will further assess; pt joking around with "family" preesnt at time; appropriate   General Comments       Exercises     Shoulder Instructions      Home Living Family/patient expects to be discharged to:: Private residence Living Arrangements: Alone Available Help at Discharge: Family;Friend(s);Available PRN/intermittently Type of Home: House Home Access: Level entry     Home Layout: 1/2 bath on main level     Bathroom Shower/Tub: Tub/shower unit;Door   [employer]:  Standard Bathroom Accessibility: Yes How Accessible: Accessible via walker Home Equipment: Walker - 2 wheels;Cane - single point;Bedside commode;Shower seat      Lives With: Alone    Prior Functioning/Environment Level of Independence: Independent        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[employer]        OT Problem List: Decreased safety awareness;Decreased activity tolerance;Decreased knowledge of use of DME or AE;Pain      OT Treatment/Interventions: Self-care/ADL training;DME and/or AE instruction;Therapeutic activities;Cognitive remediation/compensation;Patient/family education;Visual/perceptual  remediation/compensation;Balance training    OT Goals(Current goals can be found in the care plan section) Acute Rehab OT Goals Patient Stated Goal: to go home OT Goal Formulation: With patient Time For Goal Achievement: 03/22/18 Potential to Achieve Goals: Good  OT Frequency: Min 2X/week   Barriers to D/C:            Co-evaluation              AM-PAC PT "6 Clicks" Daily Activity     Outcome Measure Help from another person eating meals?: None Help from another person taking care of personal grooming?: None Help from another person toileting, which includes using toliet, bedpan, or urinal?: A Little Help from another person bathing (including washing, rinsing, drying)?: A Little Help from another person to put on and taking off regular upper body clothing?: None Help from another person to put on and taking off regular lower body clothing?: A Little 6 Click Score: 21   End of Session Equipment Utilized During Treatment: Gait belt Nurse Communication: Mobility status;Other (comment)(recommend K pad for back pain)  Activity Tolerance: Patient tolerated treatment well Patient left: in chair;with call bell/phone within reach;with chair alarm set  OT Visit Diagnosis: Unsteadiness on feet (R26.81);Other symptoms and signs involving cognitive function;Pain Pain - part of body: (back)                Time: 6440-3474 OT Time Calculation (min): 25 min Charges:  OT General Charges $OT Visit: 1 Visit OT Evaluation $OT Eval Moderate Complexity: 1 Mod OT Treatments $Self Care/Home Management : 8-22 mins  Luisa Dago, OT/L   Acute OT Clinical Specialist Acute Rehabilitation Services Pager [phone] Office [phone]   [facility] 03/08/2018, 12:27 PM

## 2018-03-08 NOTE — Progress Notes (Signed)
Transcranial Doppler  Date POD PCO2 HCT BP  MCA ACA PCA OPHT SIPH VERT Basilar  9/30 JE/ CK     Right  Left   49  63   -66  -56   -36  31   21  25    48  -49   -23  -26   -33           Right  Left                                            Right  Left                                             Right  Left                                             Right  Left                                            Right  Left                                            Right  Left                                        MCA = Middle Cerebral Artery      OPHT = Opthalmic Artery     BASILAR = Basilar Artery   ACA = Anterior Cerebral Artery     SIPH = Carotid Siphon PCA = Posterior Cerebral Artery   VERT = Verterbral Artery                   Normal MCA = 62+\-12 ACA = 50+\-12 PCA = 42+\-23   Farrel Demark, RDMS, RVT Sherren Kerns, RVS

## 2018-03-08 NOTE — Progress Notes (Signed)
Physical Therapy Treatment Patient Details Name: Masiyah Engen MRN: [id number] DOB: December 19, 1955 Today's Date: 03/08/2018    History of Present Illness Alfred Marshall is a 62 y.o. male who came to ER after he had an acute onset 10/10 HA that awoke him from sleep. Associated with dizziness, blurry vision and nausea. 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 Pt underwent coil embolization of aneurysm.    PT Comments    Pt with mild instability ambulating in the halls.  Scanning and directional changes producing mild deviation.  Pt still sensitive to light, but pt stated he improved with time in the halls.   Follow Up Recommendations  Home health PT;Supervision/Assistance - 24 hour     Equipment Recommendations  None recommended by PT    Recommendations for Other Services       Precautions / Restrictions Precautions Precautions: Fall    Mobility  Bed Mobility Overal bed mobility: Modified Independent                Transfers Overall transfer level: Needs assistance Equipment used: None Transfers: Sit to/from Stand Sit to Stand: Min guard            Ambulation/Gait Ambulation/Gait assistance: Min assist;Min guard Gait Distance (Feet): 200 Feet   Gait Pattern/deviations: Step-through pattern Gait velocity: slower Gait velocity interpretation: 1.31 - 2.62 ft/sec, indicative of limited community ambulator General Gait Details: slower and guarded occasionally needing min to recover from minor deviation with scanning or directional change.   Stairs Stairs: Yes Stairs assistance: Supervision Stair Management: One rail Right;Step to pattern;Forwards Number of Stairs: 2 General stair comments: safe holding to the rails   Wheelchair Mobility    Modified Rankin (Stroke Patients Only)       Balance Overall balance assessment: Needs assistance Sitting-balance support: Feet supported;Bilateral upper extremity supported Sitting  balance-Leahy Scale: Good       Standing balance-Leahy Scale: Fair                              Cognition Arousal/Alertness: Awake/alert Behavior During Therapy: WFL for tasks assessed/performed Overall Cognitive Status: (Not tested formally)                                        Exercises      General Comments        Pertinent Vitals/Pain Pain Assessment: 0-10 Pain Score: 2  Pain Location: headache Pain Descriptors / Indicators: Headache Pain Intervention(s): Monitored during session    Home Living                      Prior Function            PT Goals (current goals can now be found in the care plan section) Acute Rehab PT Goals Patient Stated Goal: to go home PT Goal Formulation: With patient Time For Goal Achievement: 03/21/18 Potential to Achieve Goals: Good Progress towards PT goals: Progressing toward goals    Frequency    Min 4X/week      PT Plan Current plan remains appropriate    Co-evaluation              AM-PAC PT "6 Clicks" Daily Activity  Outcome Measure  Difficulty turning over in bed (including adjusting bedclothes, sheets and blankets)?: None Difficulty moving from lying on  back to sitting on the side of the bed? : A Little Difficulty sitting down on and standing up from a chair with arms (e.g., wheelchair, bedside commode, etc,.)?: A Little Help needed moving to and from a bed to chair (including a wheelchair)?: A Little Help needed walking in hospital room?: A Little   6 Click Score: 16    End of Session   Activity Tolerance: Patient tolerated treatment well Patient left: with call bell/phone within reach;with family/visitor present;in bed Nurse Communication: Mobility status PT Visit Diagnosis: Unsteadiness on feet (R26.81)     Time: 4098-1191 PT Time Calculation (min) (ACUTE ONLY): 16 min  Charges:  $Gait Training: 8-22 mins                     03/08/2018  [place] Bing,  PT Acute Rehabilitation Services [phone]  (pager) [phone]  (office)   Eliseo Gum Sahirah Rudell 03/08/2018, 5:21 PM

## 2018-03-08 NOTE — Progress Notes (Signed)
  NEUROSURGERY PROGRESS NOTE   No issues overnight. Pt reports improved HA and nausea.  EXAM:  BP 117/60   Pulse (!) 51   Temp 98.5 F (36.9 C) (Axillary)   Resp 16   Ht 5\' 11"  (1.803 m)   Wt 103.5 kg   SpO2 95%   BMI 31.82 kg/m   Awake, alert, oriented  Speech fluent, appropriate  CN grossly intact  5/5 BUE/BLE   IMPRESSION:  62 y.o. male SAH d# 3 s/p coiling Acom aneurysm, neurologically stable  PLAN: - Cont Nimotop - OOB - TCD today

## 2018-03-09 ENCOUNTER — Encounter (HOSPITAL_COMMUNITY): Payer: Self-pay | Admitting: Neurosurgery

## 2018-03-09 LAB — GLUCOSE, CAPILLARY
GLUCOSE-CAPILLARY: 118 mg/dL — AB (ref 70–99)
GLUCOSE-CAPILLARY: 109 mg/dL — AB (ref 70–99)
Glucose-Capillary: 98 mg/dL (ref 70–99)
Glucose-Capillary: 94 mg/dL (ref 70–99)

## 2018-03-09 LAB — CBC
HCT: 42.5 % (ref 39.0–52.0)
Hemoglobin: 13.9 g/dL (ref 13.0–17.0)
MCH: 30 pg (ref 26.0–34.0)
MCHC: 32.7 g/dL (ref 30.0–36.0)
MCV: 91.8 fL (ref 78.0–100.0)
Platelets: 170 10*3/uL (ref 150–400)
RBC: 4.63 MIL/uL (ref 4.22–5.81)
RDW: 12.4 % (ref 11.5–15.5)
WBC: 7.5 10*3/uL (ref 4.0–10.5)

## 2018-03-09 LAB — COMPREHENSIVE METABOLIC PANEL
ALT: 23 U/L (ref 0–44)
AST: 23 U/L (ref 15–41)
Albumin: 3.3 g/dL — ABNORMAL LOW (ref 3.5–5.0)
Alkaline Phosphatase: 43 U/L (ref 38–126)
Anion gap: 5 (ref 5–15)
BUN: 8 mg/dL (ref 6–20)
CO2: 24 mmol/L (ref 22–32)
Calcium: 8.5 mg/dL — ABNORMAL LOW (ref 8.9–10.3)
Chloride: 109 mmol/L (ref 98–111)
Creatinine, Ser: 0.85 mg/dL (ref 0.61–1.24)
GFR calc Af Amer: >60 mL/min (ref >60)
GFR calc non Af Amer: >60 mL/min (ref >60)
Glucose, Bld: 119 mg/dL — ABNORMAL HIGH (ref 70–99)
Potassium: 3.7 mmol/L (ref 3.5–5.1)
Sodium: 138 mmol/L (ref 135–145)
Total Bilirubin: 0.8 mg/dL (ref 0.3–1.2)
Total Protein: 5.6 g/dL — ABNORMAL LOW (ref 6.5–8.1)

## 2018-03-09 LAB — T4, FREE: Free T4: 1.2 ng/dL (ref 0.82–1.77)

## 2018-03-09 LAB — TSH: TSH: 1.2 u[IU]/mL (ref 0.350–4.500)

## 2018-03-09 LAB — MAGNESIUM: Magnesium: 2.1 mg/dL (ref 1.7–2.4)

## 2018-03-09 MED ORDER — NIMODIPINE 60 MG/20ML PO SOLN: 30.0000 mg | ORAL | Status: DC

## 2018-03-09 MED ORDER — NIMODIPINE 30 MG PO CAPS
30.0000 mg | ORAL_CAPSULE | ORAL | Status: DC
  Administered 2018-03-09 – 2018-03-17 (×98): 30 mg via ORAL
  Filled 2018-03-09 (×86): qty 1

## 2018-03-09 NOTE — Plan of Care (Signed)
Pt continues to have bradycardic issues.  Now giving Nimodipine 30 mg q 2 hours to see if that may help with bradycardia.  Will continue to monitor.  Jaclyn Shaggy RN

## 2018-03-09 NOTE — Progress Notes (Signed)
  NEUROSURGERY PROGRESS NOTE   No issues overnight. Some dizziness today  EXAM:  BP 129/60   Pulse (!) 44   Temp 98.2 F (36.8 C) (Oral)   Resp 13   Ht 5\' 11"  (1.803 m)   Wt 103.5 kg   SpO2 97%   BMI 31.82 kg/m   Awake, alert, oriented  Speech fluent CN grossly intact  5/5 BUE/BLE  IMPRESSION:  62 y.o. male SAH d# 4 s/p Acom coiling. Remains neurologically well. Bradycardia, asymptomatic. Suspect is physiologic.  PLAN: - Cont current treatment - Will get EKG, BMP, TSH

## 2018-03-10 ENCOUNTER — Inpatient Hospital Stay (HOSPITAL_COMMUNITY): Payer: 59

## 2018-03-10 DIAGNOSIS — I609 Nontraumatic subarachnoid hemorrhage, unspecified: Secondary | ICD-10-CM

## 2018-03-10 LAB — GLUCOSE, CAPILLARY
GLUCOSE-CAPILLARY: 91 mg/dL (ref 70–99)
GLUCOSE-CAPILLARY: 109 mg/dL — AB (ref 70–99)
Glucose-Capillary: 130 mg/dL — ABNORMAL HIGH (ref 70–99)
Glucose-Capillary: 90 mg/dL (ref 70–99)

## 2018-03-10 NOTE — Progress Notes (Signed)
  Speech Language Pathology Treatment: Cognitive-Linquistic  Patient Details Name: Alfred Marshall MRN: [id number] DOB: July 12, 1955 Today's Date: 03/10/2018 Time: 0109-3235 SLP Time Calculation (min) (ACUTE ONLY): 12 min  Assessment / Plan / Recommendation Clinical Impression  Pt continues to make cognitive progress despite dizziness and sensitivity to light. Pt with increased drowsiness this session d/t recent medication for headache. Only deficit that is currently identifiable within cognitive tasks with delayed recall of information - which could likely be related to internal distractions such as sensitivity to light and pain. Will continue to follow for appropriate planning.    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 50 pk-yr smoking hx, no HTN, no family Hx of aneurysms. CTA demonstrates bilobed Acom aneurysm, now s/p coiling.      SLP Plan  Continue with current plan of care       Recommendations                   Follow up Recommendations: Home health SLP SLP Visit Diagnosis: Cognitive communication deficit (T73.220) Plan: Continue with current plan of care       GO                Alfred Marshall 03/10/2018, 9:38 AM

## 2018-03-10 NOTE — Progress Notes (Signed)
OT Cancellation Note  Patient Details Name: Spike Desilets MRN: [id number] DOB: 1956-01-02   Cancelled Treatment:    Reason Eval/Treat Not Completed: Other (comment); checked on pt x2 today; pt with significant headache and with slight nausea that has not improved with meds, feels unable to tolerate OT session at this time. Will follow up as schedule permits.   Marcy Siren, OT [facility] Services Pager [phone] Office [phone]   Orlando Penner 03/10/2018, 4:03 PM

## 2018-03-10 NOTE — Progress Notes (Signed)
Transcranial Doppler  Date POD PCO2 HCT BP  MCA ACA PCA OPHT SIPH VERT Basilar  9/30 JE/ CK     Right  Left   49  63   -66  -56   -36  31   21  25    48  -49   -23  -26   -33      10/02 [place]/JE     Right  Left   48  44   -35  -27   34  22   25  25    48  -32   -20  -22   -29           Right  Left                                             Right  Left                                             Right  Left                                            Right  Left                                            Right  Left                                        MCA = Middle Cerebral Artery      OPHT = Opthalmic Artery     BASILAR = Basilar Artery   ACA = Anterior Cerebral Artery     SIPH = Carotid Siphon PCA = Posterior Cerebral Artery   VERT = Verterbral Artery                   Normal MCA = 62+\-12 ACA = 50+\-12 PCA = 42+\-23   Farrel Demark, RDMS, RVT Hongying Mivaan Corbitt RDMS RVT

## 2018-03-10 NOTE — Progress Notes (Signed)
PT Cancellation Note  Patient Details Name: Alfred Marshall MRN: [id number] DOB: Dec 18, 1955   Cancelled Treatment:    Reason Eval/Treat Not Completed: Pain limiting ability to participate; attempted twice to see pt, but headache pain too intense for PT today.  Will check back another day   Elray Mcgregor 03/10/2018, 4:42 PM  Sheran Lawless, PT Acute Rehabilitation Services [phone] 03/10/2018

## 2018-03-10 NOTE — Progress Notes (Signed)
  NEUROSURGERY PROGRESS NOTE   No issues overnight. HA a little worse today  EXAM:  BP (!) 148/70   Pulse (!) 40   Temp 97.8 F (36.6 C) (Oral)   Resp 13   Ht 5\' 11"  (1.803 m)   Wt 103.5 kg   SpO2 97%   BMI 31.82 kg/m   Awake, alert, oriented  Speech fluent CN grossly intact  5/5 BUE/BLE  IMPRESSION:  62 y.o. male SAH d# 5 s/p Acom coiling. Remains neurologically well.  PLAN: - Cont current treatment - TCD today

## 2018-03-11 ENCOUNTER — Encounter (HOSPITAL_COMMUNITY): Payer: Self-pay

## 2018-03-11 LAB — GLUCOSE, CAPILLARY
GLUCOSE-CAPILLARY: 87 mg/dL (ref 70–99)
GLUCOSE-CAPILLARY: 132 mg/dL — AB (ref 70–99)
Glucose-Capillary: 84 mg/dL (ref 70–99)
Glucose-Capillary: 106 mg/dL — ABNORMAL HIGH (ref 70–99)

## 2018-03-11 NOTE — Progress Notes (Signed)
OT Cancellation Note  Patient Details Name: [name] MRN: [id number] DOB: Dec 13, 1955   Cancelled Treatment:    Reason Eval/Treat Not Completed: Fatigue/lethargy limiting ability to participate.  Attempted x 2.  Pt fatigued.   Jeani Hawking, OTR/L Acute Rehabilitation Services Pager [phone] Office [phone]   Jeani Hawking M 03/11/2018, 3:09 PM

## 2018-03-11 NOTE — Progress Notes (Signed)
  NEUROSURGERY PROGRESS NOTE   Pt seen and examined. No issues overnight. HA gone this am, pt thinks his HA were partially caffeine withdrawal. Doing well this am.  EXAM: Temp:  [97.7 F (36.5 C)-98.7 F (37.1 C)] 97.7 F (36.5 C) (10/03 0800) Pulse Rate:  [40-65] 56 (10/03 1000) Resp:  [9-23] 11 (10/03 1000) BP: (115-153)/(57-82) 117/72 (10/03 1000) SpO2:  [91 %-100 %] 97 % (10/03 1000) Intake/Output      10/02 0701 - 10/03 0700 10/03 0701 - 10/04 0700   I.V. (mL/kg) 2347 (22.7) 99.9 (1)   IV Piggyback 199.8    Total Intake(mL/kg) 2546.8 (24.6) 99.9 (1)   Urine (mL/kg/hr) 3700 (1.5)    Total Output 3700    Net -1153.2 +99.9        Urine Occurrence 2 x     Awake, alert, oriented Speech fluent CN intact Good strength throughout  LABS: Lab Results  Component Value Date   CREATININE 0.85 03/09/2018   BUN 8 03/09/2018   NA 138 03/09/2018   K 3.7 03/09/2018   CL 109 03/09/2018   CO2 24 03/09/2018   Lab Results  Component Value Date   WBC 7.5 03/09/2018   HGB 13.9 03/09/2018   HCT 42.5 03/09/2018   MCV 91.8 03/09/2018   PLT 170 03/09/2018    TCD: Date POD PCO2 HCT BP  MCA ACA PCA OPHT SIPH VERT Basilar  9/30 JE/ CK     Right  Left   49  63   -66  -56   -36  31   21  25    48  -49   -23  -26   -33      10/02 [place]/JE     Right  Left   48  44   -35  -27   34  22   25  25    48  -32   -20  -22   -29       Normal velocities, not concerning for spasm  IMPRESSION: - 62 y.o. male SAH d# 6 s/p Acom aneurysm coiling, neurologically stable  PLAN: - Cont Nimotop - OOB with PT/OT

## 2018-03-11 NOTE — Progress Notes (Addendum)
Physical Therapy Treatment Patient Details Name: Alfred Marshall MRN: [id number] DOB: 12/23/1955 Today's Date: 03/11/2018    History of Present Illness Alfred Marshall is a 62 y.o. male who came to ER after he had an acute onset 10/10 HA that awoke him from sleep. Associated with dizziness, blurry vision and nausea. 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 Pt underwent coil embolization of aneurysm.    PT Comments    Overall improved.  Less sensitive to light with significantly improved HA.  Static and dynamic balance improvements and gait less unsteady   Follow Up Recommendations  Home health PT;Supervision/Assistance - 24 hour  Transition to OPPT     Equipment Recommendations  None recommended by PT    Recommendations for Other Services       Precautions / Restrictions Precautions Precautions: Fall    Mobility  Bed Mobility Overal bed mobility: Modified Independent             General bed mobility comments: extra time today.   Transfers Overall transfer level: Needs assistance   Transfers: Sit to/from Stand Sit to Stand: Min guard         General transfer comment: mild unsteadiness.  Used bed frame for stability  Ambulation/Gait Ambulation/Gait assistance: Min assist;Min guard Gait Distance (Feet): 280 Feet   Gait Pattern/deviations: Step-through pattern Gait velocity: slower Gait velocity interpretation: <1.8 ft/sec, indicate of risk for recurrent falls General Gait Details: slower, guarded, wide BOS, flexed posture, but after cuing, maintain view out ahead.  No overt LOB, still mildly unsteady   Stairs             Wheelchair Mobility    Modified Rankin (Stroke Patients Only)       Balance Overall balance assessment: Needs assistance   Sitting balance-Leahy Scale: Good       Standing balance-Leahy Scale: Fair                              Cognition Arousal/Alertness: Awake/alert Behavior  During Therapy: WFL for tasks assessed/performed Overall Cognitive Status: Within Functional Limits for tasks assessed(not tested formally)                                        Exercises      General Comments        Pertinent Vitals/Pain Pain Assessment: Faces Faces Pain Scale: Hurts a little bit Pain Location: headache Pain Descriptors / Indicators: Aching Pain Intervention(s): Monitored during session    Home Living                      Prior Function            PT Goals (current goals can now be found in the care plan section) Acute Rehab PT Goals Patient Stated Goal: to go home PT Goal Formulation: With patient Time For Goal Achievement: 03/21/18 Potential to Achieve Goals: Good Progress towards PT goals: Progressing toward goals    Frequency    Min 4X/week      PT Plan Current plan remains appropriate    Co-evaluation              AM-PAC PT "6 Clicks" Daily Activity  Outcome Measure  Difficulty turning over in bed (including adjusting bedclothes, sheets and blankets)?: None Difficulty moving from lying on back  to sitting on the side of the bed? : A Little Difficulty sitting down on and standing up from a chair with arms (e.g., wheelchair, bedside commode, etc,.)?: A Little Help needed moving to and from a bed to chair (including a wheelchair)?: A Little Help needed walking in hospital room?: A Little Help needed climbing 3-5 steps with a railing? : A Lot 6 Click Score: 18    End of Session   Activity Tolerance: Patient tolerated treatment well Patient left: Other (comment)(left with the nurse to clean up) Nurse Communication: Mobility status PT Visit Diagnosis: Unsteadiness on feet (R26.81)     Time: 1610-9604 PT Time Calculation (min) (ACUTE ONLY): 16 min  Charges:  $Gait Training: 8-22 mins                     03/11/2018  [place] Bing, PT Acute Rehabilitation Services [phone]  (pager) [phone]   (office)   Alfred Marshall 03/11/2018, 5:53 PM

## 2018-03-12 ENCOUNTER — Inpatient Hospital Stay (HOSPITAL_COMMUNITY): Payer: 59

## 2018-03-12 DIAGNOSIS — I609 Nontraumatic subarachnoid hemorrhage, unspecified: Secondary | ICD-10-CM

## 2018-03-12 LAB — GLUCOSE, CAPILLARY
GLUCOSE-CAPILLARY: 85 mg/dL (ref 70–99)
GLUCOSE-CAPILLARY: 119 mg/dL — AB (ref 70–99)
Glucose-Capillary: 108 mg/dL — ABNORMAL HIGH (ref 70–99)
Glucose-Capillary: 131 mg/dL — ABNORMAL HIGH (ref 70–99)

## 2018-03-12 MED ORDER — ENSURE ENLIVE PO LIQD
237.0000 mL | Freq: Two times a day (BID) | ORAL | Status: DC
  Administered 2018-03-12 – 2018-03-13 (×2): 237 mL via ORAL

## 2018-03-12 NOTE — Progress Notes (Addendum)
Initial Nutrition Assessment  DOCUMENTATION CODES:   Obesity unspecified  INTERVENTION:  Ensure Enlive BID; each supplement provides 350 kcal and 20 grams protein   Counseled patient on identifying, ordering menu items to increase po intake   Provided brief diet counseling to pt concerning his regular high-fat diet   NUTRITION DIAGNOSIS:   Inadequate oral intake related to decreased appetite, acute illness as evidenced by per patient/family report, meal completion < 50%.   GOAL:   Patient will meet greater than or equal to 90% of their needs   MONITOR:   PO intake, Supplement acceptance, Labs, Weight trends  REASON FOR ASSESSMENT:   Consult Poor PO  ASSESSMENT:   Alfred Marshall is a 62 yo male with PMH of type 2 diabetes, tobacco use who is admitted for subarachnoid hemorrhage. 9/28 underwent diagnostic cerebral angiogram and coil embolization of Acom aneurysm.   Visited with Alfred Marshall at bedside today with multiple visitors present. This patient works for the [employer], lives alone but has a strong social support system. He has had poor po intake during hospitalization due to a lack of appetite, "nothing sounds good" per his report. He has had visitors bringing him outside food such as Egg McMuffins which he enjoyed. He reported his headache, nausea, and lack of appetite have improved in the past two days.   Alfred Marshall was weight stable and had hearty appetite PTA. 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  He favors high-fat foods such as burgers, hot dogs, fries, milkshakes. He drinks 8-10 Diet Pepsis per day and doesn't snack. He says that this diet has not been a problem for him because he doesn't gain weight and is physically active at work. RD provided brief counseling on the possible long-term effects of his regular diet including heart  disease, hypertension, worsening diabetes. Per chart, he was dx with type 2 diabetes in 2017 with A1c 7.5%, was prescribed metformin; patient says he takes it "sometimes".  Patient is amenable to trying Ensure (any flavor) to supply enough calories and protein during his stay. He has also been counseled on identifying different items on the menu that he would be willing to eat.   Labs reviewed: CBG 84-132 Medications reviewed and include: Colace, metformin, Protonix  NUTRITION - FOCUSED PHYSICAL EXAM:    Most Recent Value  Orbital Region  No depletion  Upper Arm Region  No depletion  Thoracic and Lumbar Region  No depletion  Buccal Region  No depletion  Temple Region  No depletion  Clavicle Bone Region  No depletion  Clavicle and Acromion Bone Region  No depletion  Scapular Bone Region  No depletion  Dorsal Hand  No depletion  Patellar Region  No depletion  Anterior Thigh Region  No depletion  Posterior Calf Region  Mild depletion  Edema (RD Assessment)  None  Hair  Reviewed  Eyes  Reviewed  Mouth  Reviewed  Skin  Reviewed  Nails  Reviewed     Diet Order:   Diet Order            Diet Carb Modified Fluid consistency: Thin; Room service appropriate? Yes  Diet effective now              EDUCATION NEEDS:   Education needs have been addressed  Skin:  Skin Assessment: Reviewed RN Assessment  Last BM:  9/28  Height:   Ht Readings from Last 1 Encounters:  03/06/18 5\' 11"  (1.803 m)    Weight:   Wt Readings from Last 1 Encounters:  03/06/18 103.5 kg    Ideal Body Weight:  78.2 kg  BMI:  Body mass index is 31.82 kg/m.  Estimated Nutritional Needs:   Kcal:  1900-2100  Protein:  95-105  Fluid:  >/=1.9 L    Leather Estis, Dietetic Intern [phone]

## 2018-03-12 NOTE — Progress Notes (Signed)
Physical Therapy Treatment Patient Details Name: Alfred Marshall MRN: [id number] DOB: [birth year] Today's Date: 03/12/2018    History of Present Illness Alfred Marshall is a 61 y.o. male who came to ER after he had an acute onset 10/10 HA that awoke him from sleep. Associated with dizziness, blurry vision and nausea. 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 Pt underwent coil embolization of aneurysm.    PT Comments    Improving Steadily, but pt still doesn't fully appreciate the importance of mobilizing.  Pt is close to min guard level with episodes of min assist for stability.   Follow Up Recommendations  Home health PT;Supervision/Assistance - 24 hour;Other (comment)(to OPPT once appropriate)     Equipment Recommendations  None recommended by PT    Recommendations for Other Services       Precautions / Restrictions Precautions Precautions: Fall    Mobility  Bed Mobility Overal bed mobility: Modified Independent                Transfers Overall transfer level: Needs assistance Equipment used: None Transfers: Sit to/from Stand Sit to Stand: Min guard         General transfer comment: mild unsteadiness.  Used bed frame for stability  Ambulation/Gait Ambulation/Gait assistance: Min guard Gait Distance (Feet): 500 Feet   Gait Pattern/deviations: Step-through pattern Gait velocity: slower Gait velocity interpretation: 1.31 - 2.62 ft/sec, indicative of limited community ambulator General Gait Details: prefers slower, but increased speed noticeably to cuing.  Episodes of unsteadiness, that pt can self recover from, wide BOS   Stairs             Wheelchair Mobility    Modified Rankin (Stroke Patients Only) Modified Rankin (Stroke Patients Only) Pre-Morbid Rankin Score: No symptoms Modified Rankin: Moderate disability     Balance Overall balance assessment: Needs assistance Sitting-balance support: Feet supported;Bilateral  upper extremity supported Sitting balance-Leahy Scale: Good       Standing balance-Leahy Scale: Fair                              Cognition Arousal/Alertness: Awake/alert Behavior During Therapy: WFL for tasks assessed/performed(apathy) Overall Cognitive Status: Within Functional Limits for tasks assessed                                        Exercises      General Comments General comments (skin integrity, edema, etc.): no longer photophobic, vss, EHR 62 bpm      Pertinent Vitals/Pain Pain Assessment: No/denies pain(in pain during procedure this morning, not now)    Home Living                      Prior Function            PT Goals (current goals can now be found in the care plan section) Acute Rehab PT Goals Patient Stated Goal: to go home PT Goal Formulation: With patient Time For Goal Achievement: 03/21/18 Potential to Achieve Goals: Good Progress towards PT goals: Progressing toward goals    Frequency    Min 4X/week      PT Plan Current plan remains appropriate    Co-evaluation              AM-PAC PT "6 Clicks" Daily Activity  Outcome Measure  Difficulty turning over  in bed (including adjusting bedclothes, sheets and blankets)?: None Difficulty moving from lying on back to sitting on the side of the bed? : A Little Difficulty sitting down on and standing up from a chair with arms (e.g., wheelchair, bedside commode, etc,.)?: A Little Help needed moving to and from a bed to chair (including a wheelchair)?: A Little Help needed walking in hospital room?: A Little Help needed climbing 3-5 steps with a railing? : A Little 6 Click Score: 19    End of Session   Activity Tolerance: Patient tolerated treatment well   Nurse Communication: Mobility status PT Visit Diagnosis: Unsteadiness on feet (R26.81)     Time: 1610-9604 PT Time Calculation (min) (ACUTE ONLY): 18 min  Charges:  $Gait Training: 8-22  mins                     03/12/2018  [place] Bing, PT Acute Rehabilitation Services [phone]  (pager) [phone]  (office)   Eliseo Gum Divine Hansley 03/12/2018, 4:41 PM

## 2018-03-12 NOTE — Progress Notes (Signed)
  NEUROSURGERY PROGRESS NOTE   No issues overnight.  HA improved with caffeine No concerns this am  EXAM:  BP 132/75   Pulse (!) 48   Temp 98.4 F (36.9 C) (Oral)   Resp 12   Ht 5\' 11"  (1.803 m)   Wt 103.5 kg   SpO2 93%   BMI 31.82 kg/m   Awake, alert, oriented  Speech fluent, appropriate  CN grossly intact  MAEW with good strength. No focal deficits  IMPRESSION:  62 y.o. male SAH d#7 s/p Acom aneurysm coiling, neurologically stable - Continue nimotop - Continue to work with therapy

## 2018-03-12 NOTE — Progress Notes (Signed)
Transcranial Doppler  Date POD PCO2 HCT BP  MCA ACA PCA OPHT SIPH VERT Basilar  9/30 JE/ CK     Right  Left   49  63   -66  -56   -36  31   21  25    48  -49   -23  -26   -33      10/02 [place]/JE     Right  Left   48  44   -35  -27   34  22   25  25    48  -32   -20  -22   -29      10/4 JE/ MR     Right  Left   51  42   -31  -41   20  33   17  25   38  38   -26  -20   -23            Right  Left                                             Right  Left                                            Right  Left                                            Right  Left                                        MCA = Middle Cerebral Artery      OPHT = Opthalmic Artery     BASILAR = Basilar Artery   ACA = Anterior Cerebral Artery     SIPH = Carotid Siphon PCA = Posterior Cerebral Artery   VERT = Verterbral Artery                   Normal MCA = 62+\-12 ACA = 50+\-12 PCA = 42+\-23   Farrel Demark, RDMS, RVT

## 2018-03-13 LAB — GLUCOSE, CAPILLARY
GLUCOSE-CAPILLARY: 89 mg/dL (ref 70–99)
Glucose-Capillary: 95 mg/dL (ref 70–99)
Glucose-Capillary: 99 mg/dL (ref 70–99)

## 2018-03-13 NOTE — Progress Notes (Signed)
Alfred Marshall at [facility] that pt did not get a lunch tray. She states he told nurtritional serv. Not to bring one this morning. I asked for him to please receive a dinner tray.

## 2018-03-13 NOTE — Progress Notes (Signed)
Overall stable.  No new problems overnight.  Minimal headache.  Vital signs stable.  Afebrile.  Awake and alert.  Oriented and appropriate.  Following commands bilaterally.  Strength equal.  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  Continue ICU observation and IV fluids.  Overall doing well.

## 2018-03-14 LAB — GLUCOSE, CAPILLARY
GLUCOSE-CAPILLARY: 130 mg/dL — AB (ref 70–99)
Glucose-Capillary: 108 mg/dL — ABNORMAL HIGH (ref 70–99)
Glucose-Capillary: 105 mg/dL — ABNORMAL HIGH (ref 70–99)

## 2018-03-14 NOTE — Progress Notes (Signed)
Overall progressing well.  Patient up ambulating in the halls today.  Minimal headache.  Speech fluent.  Motor and sensory function intact.  Overall progressing well.  Continue IV fluids and mobilization.  Possible discharge home soon.

## 2018-03-14 NOTE — Progress Notes (Signed)
Rash noted to pt back and underarms. Pt states he has sensitive skin and thinks it was from our soap. Please assess since medication reaction has not been ruled out.

## 2018-03-14 NOTE — Care Management (Addendum)
Discussed home health therapy with patient and son.  Patient states he would like to consider agency options and will let us know.  HH list left with patient's son.

## 2018-03-15 ENCOUNTER — Inpatient Hospital Stay (HOSPITAL_COMMUNITY): Payer: 59

## 2018-03-15 DIAGNOSIS — I609 Nontraumatic subarachnoid hemorrhage, unspecified: Secondary | ICD-10-CM

## 2018-03-15 LAB — GLUCOSE, CAPILLARY
GLUCOSE-CAPILLARY: 113 mg/dL — AB (ref 70–99)
Glucose-Capillary: 86 mg/dL (ref 70–99)
Glucose-Capillary: 130 mg/dL — ABNORMAL HIGH (ref 70–99)
Glucose-Capillary: 87 mg/dL (ref 70–99)

## 2018-03-15 MED ORDER — DIPHENHYDRAMINE-ZINC ACETATE 2-0.1 % EX CREA
TOPICAL_CREAM | Freq: Three times a day (TID) | CUTANEOUS | Status: DC | PRN
  Administered 2018-03-15: 16:00:00 via TOPICAL
  Filled 2018-03-15: qty 56

## 2018-03-15 MED ORDER — PIMECROLIMUS 1 % EX CREA
1.0000 "application " | TOPICAL_CREAM | Freq: Two times a day (BID) | CUTANEOUS | Status: DC
  Administered 2018-03-16 – 2018-03-17 (×2): 1 via TOPICAL
  Filled 2018-03-15 (×18): qty 30

## 2018-03-15 NOTE — Progress Notes (Signed)
Transcranial Doppler  Date POD PCO2 HCT BP  MCA ACA PCA OPHT SIPH VERT Basilar  9/30 JE/ CK     Right  Left   49  63   -66  -56   -36  31   21  25    48  -49   -23  -26   -33      10/02 [place]/JE     Right  Left   48  44   -35  -27   34  22   25  25    48  -32   -20  -22   -29      10/4 JE/ MR     Right  Left   51  42   -31  -41   20  33   17  25   38  38   -26  -20   -23      10/7 VS      Right  Left   63  57   -32  -40   -27  40   24  24   51  41   -25  -24   NI            Right  Left                                            Right  Left                                            Right  Left                                        MCA = Middle Cerebral Artery      OPHT = Opthalmic Artery     BASILAR = Basilar Artery   ACA = Anterior Cerebral Artery     SIPH = Carotid Siphon PCA = Posterior Cerebral Artery   VERT = Verterbral Artery                   Normal MCA = 62+\-12 ACA = 50+\-12 PCA = 42+\-23   Farrel Demark, RDMS, RVT 10/7 [place] ratio Rt - 1.4 Lt 1.63 Basilar not insonated

## 2018-03-15 NOTE — Progress Notes (Signed)
  NEUROSURGERY PROGRESS NOTE   No issues overnight. Minimal HA. Has a rash on his back and armpits. Says its improved from the weekend.  EXAM:  BP (!) 155/89   Pulse (!) 44   Temp 98.2 F (36.8 C) (Oral)   Resp 12   Ht 5\' 11"  (1.803 m)   Wt 103.5 kg   SpO2 93%   BMI 31.82 kg/m   Awake, alert, oriented  Speech fluent, appropriate  CN grossly intact  5/5 BUE/BLE   IMPRESSION:  62 y.o. male SAH d# 10 s/p coiling Acom aneurysm, doing well  PLAN: - Cont to mobilize - TCD today - If neurologically stable with reassuring TCD, may d/c later this week, Wed or Thurs.

## 2018-03-16 LAB — GLUCOSE, CAPILLARY
GLUCOSE-CAPILLARY: 130 mg/dL — AB (ref 70–99)
GLUCOSE-CAPILLARY: 112 mg/dL — AB (ref 70–99)
Glucose-Capillary: 87 mg/dL (ref 70–99)
Glucose-Capillary: 137 mg/dL — ABNORMAL HIGH (ref 70–99)

## 2018-03-16 MED ORDER — LEVETIRACETAM 500 MG PO TABS
500.0000 mg | ORAL_TABLET | Freq: Two times a day (BID) | ORAL | Status: DC
  Administered 2018-03-16 – 2018-03-17 (×2): 500 mg via ORAL
  Filled 2018-03-16 (×2): qty 1

## 2018-03-16 NOTE — Progress Notes (Signed)
Occupational Therapy Treatment Patient Details Name: Alfred Marshall MRN: [id number] DOB: 10/12/1955 Today's Date: 03/16/2018    History of present illness Alfred Marshall is a 62 y.o. male who came to ER after he had an acute onset 10/10 HA that awoke him from sleep. Associated with dizziness, blurry vision and nausea. 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 Pt underwent coil embolization of aneurysm.   OT comments  Pt has made significant progress. Able to complete 4/5 step functional sequencing task. Continue to recommend follow up with HHOT to further address home safety and IADL tasks within the home and initial 24/7 S upon DC. HHOT can further assess any need for neuro outpt OT to facilitate return to work strategies. All further OT to be addressed in next venue of care. Acute OT signing off. Maurie Boettcher, OT/L   Acute OT Clinical Specialist Acute Rehabilitation Services Pager [phone] Office [phone]   Follow Up Recommendations  Home health OT;Supervision - initially 24/7 then Intermittent    Equipment Recommendations  None recommended by OT    Recommendations for Other Services      Precautions / Restrictions Precautions Precautions: None       Mobility Bed Mobility Overal bed mobility: Modified Independent                Transfers Overall transfer level: Modified independent Equipment used: None Transfers: Sit to/from Stand Sit to Stand: Modified independent (Device/Increase time);Supervision             Balance Overall balance assessment: Modified Independent Sitting-balance support: No upper extremity supported Sitting balance-Leahy Scale: Good       Standing balance-Leahy Scale: Good                        ADL either performed or assessed with clinical judgement   ADL                                         General ADL Comments: Overall set up; pt able to discuss his medicaiton  management schedule; Educated on recommnedation for S with medication and financial management after DC. Pt verazlied understanding.      Vision       Perception     Praxis      Cognition Arousal/Alertness: Awake/alert Behavior During Therapy: WFL for tasks assessed/performed Overall Cognitive Status: Within Functional Limits for tasks assessed                                          Exercises     Shoulder Instructions       General Comments      Pertinent Vitals/ Pain       Pain Assessment: No/denies pain  Home Living                                          Prior Functioning/Environment              Frequency           Progress Toward Goals  OT Goals(current goals can now be found in the care plan section)  Progress towards OT goals: Goals met/education  completed, patient discharged from OT  Acute Rehab OT Goals Patient Stated Goal: to go home OT Goal Formulation: With patient Time For Goal Achievement: 03/22/18 Potential to Achieve Goals: Good ADL Goals Additional ADL Goal #1: Pt will independently follow 3 step commands during ADL task in minimally distracting environment. Additional ADL Goal #2: Pt will demonstrate anticipatory awareness during ADL task in moderately distracting environment.  Plan Discharge plan remains appropriate    Co-evaluation                 AM-PAC PT "6 Clicks" Daily Activity     Outcome Measure   Help from another person eating meals?: None Help from another person taking care of personal grooming?: None Help from another person toileting, which includes using toliet, bedpan, or urinal?: None Help from another person bathing (including washing, rinsing, drying)?: None Help from another person to put on and taking off regular upper body clothing?: None Help from another person to put on and taking off regular lower body clothing?: None 6 Click Score: 24    End of Session     OT Visit Diagnosis: Unsteadiness on feet (R26.81);Other symptoms and signs involving cognitive function;Pain   Activity Tolerance Patient tolerated treatment well   Patient Left in chair;with call bell/phone within reach   Nurse Communication Mobility status        Time: 1726-1750 OT Time Calculation (min): 24 min  Charges: OT General Charges $OT Visit: 1 Visit OT Treatments $Self Care/Home Management : 23-37 mins  Maurie Boettcher, OT/L   Acute OT Clinical Specialist [place] Pager [phone] Office [phone]    [facility] 03/16/2018, 6:07 PM

## 2018-03-16 NOTE — Care Management Note (Signed)
Case Management Note  Patient Details  Name: Destan Franchini MRN: [id number] Date of Birth: Dec 23, 1955  Subjective/Objective:  Alfred Marshall is a 62 y.o. male who came to ER after he had an acute onset 10/10 HA that awoke him from sleep. Associated with dizziness, blurry vision and nausea. 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 Pt underwent coil embolization of aneurysm.  PTA, pt independent, lives alone.                    Action/Plan: PT/OT recommending HH follow up at dc.  Met with pt to discuss dc plans:  He is agreeable to [facility] follow up at dc, and states he has all needed DME from when his mother was ill (ie, RW, BSC, shower chair, etc), if needed.  Pt made aware of recommendation of 24h supervision initially at home, and he states he can hire paid caregivers if this is needed short term.  Pt has no preference for [facility] agency, just wants to make sure they do a good job.  Will refer to [facility] for [facility] follow up of [facility] services.  Will check coverage for Nimotop with pt's insurance prior to dc.  Would recommend hard copy Rx for Nimotop, as will likely need to be filled at [place].  Most pharmacies do not have this medication in stock.    Expected Discharge Date:                  Expected Discharge Plan:  [place]  In-House Referral:     Discharge planning Services  CM Consult, Medication Assistance  Post Acute Care Choice:  Home Health Choice offered to:     DME Arranged:    DME Agency:     HH Arranged:    HH Agency:     Status of Service:  In process, will continue to follow  If discussed at Long Length of Stay Meetings, dates discussed:    Additional Comments:  Reinaldo Raddle, RN, BSN  Trauma/Neuro ICU Case Manager [phone]

## 2018-03-16 NOTE — Progress Notes (Signed)
Physical Therapy Treatment Patient Details Name: Alfred Marshall MRN: [id number] DOB: 08/12/1955 Today's Date: 03/16/2018    History of Present Illness Alfred Marshall is a 62 y.o. male who came to ER after he had an acute onset 10/10 HA that awoke him from sleep. Associated with dizziness, blurry vision and nausea. 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 Pt underwent coil embolization of aneurysm.    PT Comments    Much improved.  DGI 21/24 with very mild deviation with more complex activity.   Follow Up Recommendations  Home health PT;Supervision/Assistance - 24 hour;Other (comment)     Equipment Recommendations  None recommended by PT    Recommendations for Other Services       Precautions / Restrictions Precautions Precautions: Fall    Mobility  Bed Mobility Overal bed mobility: Modified Independent                Transfers Overall transfer level: Needs assistance Equipment used: None Transfers: Sit to/from Stand Sit to Stand: Modified independent (Device/Increase time);Supervision         General transfer comment: no hands, no unsteadiness  Ambulation/Gait Ambulation/Gait assistance: Supervision Gait Distance (Feet): 400 Feet   Gait Pattern/deviations: Step-through pattern   Gait velocity interpretation: >2.62 ft/sec, indicative of community ambulatory General Gait Details: steady with age appropriate speed, BOS a little wide.   Stairs Stairs: Yes Stairs assistance: Supervision Stair Management: No rails;Alternating pattern;Forwards Number of Stairs: 5 General stair comments: safer holding the rail   Wheelchair Mobility    Modified Rankin (Stroke Patients Only) Modified Rankin (Stroke Patients Only) Pre-Morbid Rankin Score: No symptoms Modified Rankin: No significant disability     Balance Overall balance assessment: Needs assistance Sitting-balance support: No upper extremity supported Sitting balance-Leahy  Scale: Good       Standing balance-Leahy Scale: Good                   Standardized Balance Assessment Standardized Balance Assessment : Dynamic Gait Index   Dynamic Gait Index Level Surface: Normal Change in Gait Speed: Normal Gait with Horizontal Head Turns: Normal Gait with Vertical Head Turns: Normal Gait and Pivot Turn: Normal Step Over Obstacle: Mild Impairment Step Around Obstacles: Mild Impairment Steps: Mild Impairment Total Score: 21      Cognition Arousal/Alertness: Awake/alert Behavior During Therapy: WFL for tasks assessed/performed Overall Cognitive Status: Within Functional Limits for tasks assessed                                        Exercises      General Comments        Pertinent Vitals/Pain Pain Assessment: No/denies pain    Home Living                      Prior Function            PT Goals (current goals can now be found in the care plan section) Acute Rehab PT Goals Patient Stated Goal: to go home PT Goal Formulation: With patient Time For Goal Achievement: 03/21/18 Potential to Achieve Goals: Good Progress towards PT goals: Progressing toward goals    Frequency    Min 4X/week      PT Plan Current plan remains appropriate    Co-evaluation              AM-PAC PT "6 Clicks" Daily  Activity  Outcome Measure  Difficulty turning over in bed (including adjusting bedclothes, sheets and blankets)?: None Difficulty moving from lying on back to sitting on the side of the bed? : None Difficulty sitting down on and standing up from a chair with arms (e.g., wheelchair, bedside commode, etc,.)?: None Help needed moving to and from a bed to chair (including a wheelchair)?: None Help needed walking in hospital room?: A Little Help needed climbing 3-5 steps with a railing? : A Little 6 Click Score: 22    End of Session   Activity Tolerance: Patient tolerated treatment well Patient left: Other  (comment) Nurse Communication: Mobility status PT Visit Diagnosis: Unsteadiness on feet (R26.81)     Time: 2725-3664 PT Time Calculation (min) (ACUTE ONLY): 21 min  Charges:  $Gait Training: 8-22 mins                     03/16/2018   Bing, PT Acute Rehabilitation Services [phone]  (pager) [phone]  (office)   Alfred Marshall 03/16/2018, 5:24 PM

## 2018-03-16 NOTE — Progress Notes (Signed)
  Speech Language Pathology Treatment: Cognitive-Linquistic  Patient Details Name: Nial Hawe MRN: [id number] DOB: 06-Oct-1955 Today's Date: 03/16/2018 Time: 1478-2956 SLP Time Calculation (min) (ACUTE ONLY): 25 min  Assessment / Plan / Recommendation Clinical Impression  Pt was seen for skilled ST targeting cognitive goals.  Pt was seated upright in recliner with family at bedside.  Pt reports much improved pain management with fewer headaches and less light sensitivity.  Pt could recall specific details from the last several day regarding his hospital course and various procedures and medications with mod I.  He could not identify any barriers to discharge other than being on IV medications.  Pt was rather verbose and slightly tangential in his responses; however, I believe this is likely related to baseline personality versus acute cognitive dysfunction as pt would eventually return to topic at hand without redirection.  Pt may benefit from ST follow up at next level of care for ongoing diagnostic treatment of higher level cognition; however, it seems that since his pain has become better controlled that he is closer to his baseline.  Subjectively, pt reports at time feeling in a "cloud" in regards to his cognition but overall feels he is improving.    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 50 pk-yr smoking hx, no HTN, no family Hx of aneurysms. CTA demonstrates bilobed Acom aneurysm, now s/p coiling.      SLP Plan  Continue with current plan of care       Recommendations                   Follow up Recommendations: Home health SLP SLP Visit Diagnosis: Cognitive communication deficit (O13.086) Plan: Continue with current plan of care       GO                Cloey Sferrazza, Melanee Spry 03/16/2018, 4:30 PM

## 2018-03-16 NOTE — Progress Notes (Signed)
  NEUROSURGERY PROGRESS NOTE   No issues overnight. No complaints.  EXAM:  BP (!) 144/99   Pulse (!) 53   Temp 97.8 F (36.6 C) (Oral)   Resp 14   Ht 5\' 11"  (1.803 m)   Wt 103.5 kg   SpO2 98%   BMI 31.82 kg/m   Awake, alert, oriented  Speech fluent, appropriate  CN grossly intact  5/5 BUE/BLE   TCD: Date POD PCO2 HCT BP  MCA ACA PCA OPHT SIPH VERT Basilar  9/30 JE/ CK     Right  Left   49  63   -66  -56   -36  31   21  25    48  -49   -23  -26   -33      10/02 [place]/JE     Right  Left   48  44   -35  -27   34  22   25  25    48  -32   -20  -22   -29      10/4 JE/ MR     Right  Left   51  42   -31  -41   20  33   17  25   38  38   -26  -20   -23      10/7 VS      Right  Left   63  57   -32  -40   -27  40   24  24   51  41   -25  -24   NI       Remains normal  IMPRESSION:  62 y.o. male SAH d#11 s/p Acom coiling, doing well   PLAN: - Cont to mobilize - May look at d/c tomorrow after TCD if stable.

## 2018-03-17 ENCOUNTER — Inpatient Hospital Stay (HOSPITAL_COMMUNITY): Payer: 59

## 2018-03-17 DIAGNOSIS — I609 Nontraumatic subarachnoid hemorrhage, unspecified: Secondary | ICD-10-CM

## 2018-03-17 LAB — GLUCOSE, CAPILLARY: GLUCOSE-CAPILLARY: 86 mg/dL (ref 70–99)

## 2018-03-17 MED ORDER — LEVETIRACETAM 500 MG PO TABS: 500.0000 mg | ORAL_TABLET | Freq: Two times a day (BID) | ORAL | 2 refills | Status: DC

## 2018-03-17 MED ORDER — LEVETIRACETAM 500 MG PO TABS: 500.0000 mg | ORAL_TABLET | Freq: Two times a day (BID) | ORAL | 3 refills | Status: DC

## 2018-03-17 MED ORDER — NIMODIPINE 30 MG PO CAPS: 30.0000 mg | ORAL_CAPSULE | ORAL | 0 refills | Status: DC

## 2018-03-17 MED FILL — niMODipine 30 MG CAPS: 30 | 9 days supply | Qty: 108 | Fill #0

## 2018-03-17 MED FILL — levETIRAcetam 500 MG TABS: 500 | 30 days supply | Qty: 60 | Fill #0 | Status: TO

## 2018-03-17 NOTE — Discharge Summary (Addendum)
Physician Discharge Summary  Patient ID: Alfred Marshall MRN: [id number] DOB/AGE: Aug 28, 1955 62 y.o.  Admit date: 03/06/2018 Discharge date: 03/17/2018  Admission Diagnoses:  [facility]  Discharge Diagnoses:  Same Active Problems:   Subarachnoid hemorrhage [facility])  Discharged Condition: Stable  Hospital Course:  Clevester Helzer is a 62 y.o. male who presented to the ER on 9/28 due to acute onset HA that awoke him from sleep. He was found to have a ruptured ACOM. He underwent emergent surgery as below same day. He had an uncomplicated post operative course. He underwent serial TCDs which were all negative for evidence of vasospasm. On day 12, he remained neurologically stable. TCDs remained normal. Okay for d/c home.  At time of discharge, pain was well controlled, ambulating with Pt/OT, tolerating po, voiding normal. Ready for discharge. Will need to complete 21 course of niomotop for vasospasm prevention.  Treatments: Surgery Diagnostic cerebral angiogram, Coil embolization of Acom aneurysm  Discharge Exam: Blood pressure (!) 142/74, pulse (!) 52, temperature 98.3 F (36.8 C), temperature source Oral, resp. rate 15, height 5\' 11"  (1.803 m), weight 103.5 kg, SpO2 95 %. Awake, alert, oriented Speech fluent, appropriate CN grossly intact 5/5 BUE/BLE Wound c/d/i  Disposition: Discharge disposition: 01-Home or Self Care       Discharge Instructions    Ambulatory referral to Home Health   Complete by:  As directed    Please evaluate Thien Berka for admission to [facility].  Disciplines requested: Nursing, Physical Therapy and Occupational Therapy  Services to provide: Other: as above  Physician to follow patient's care (the person listed here will be responsible for signing ongoing orders): Referring Provider  Requested Start of Care Date: Tomorrow  I certify that this patient is under my care and that I, or a Nurse Practitioner or Physician's Assistant working with me, had  a face-to-face encounter that meets the physician face-to-face requirements with patient on 03/17/2018. The encounter with the patient was in whole, or in part for the following medical condition(s) which is the primary reason for home health care (List medical condition). Gait instability  Special Instructions:   Does the patient have Medicare or Medicaid?:  Yes   The encounter with the patient was in whole, or in part, for the following medical condition, which is the primary reason for home health care:  gait instability   Reason for Medically Necessary Home Health Services:   Therapy- Home Adaptation to Facilitate Safety Therapy- Therapeutic Exercises to Increase Strength and Endurance     My clinical findings support the need for the above services:   Unable to leave home safely without assistance and/or assistive device Unsafe ambulation due to balance issues     I certify that, based on my findings, the following services are medically necessary home health services:  Physical therapy   Further, I certify that my clinical findings support that this patient is homebound due to:  Unable to leave home safely without assistance   Call MD for:  difficulty breathing, headache or visual disturbances   Complete by:  As directed    Call MD for:  persistant dizziness or light-headedness   Complete by:  As directed    Call MD for:  redness, tenderness, or signs of infection (pain, swelling, redness, odor or green/yellow discharge around incision site)   Complete by:  As directed    Call MD for:  severe uncontrolled pain   Complete by:  As directed    Call MD for:  temperature >100.4  Complete by:  As directed    Diet general   Complete by:  As directed    Driving Restrictions   Complete by:  As directed    Do not drive until given clearance.   Increase activity slowly   Complete by:  As directed      Allergies as of 03/17/2018      Reactions   Penicillins Other (See Comments)    Childhood allergy       Medication List    STOP taking these medications   aspirin EC 81 MG tablet   clindamycin 300 MG capsule Commonly known as:  CLEOCIN     TAKE these medications   levETIRAcetam 500 MG tablet Commonly known as:  KEPPRA Take 1 tablet (500 mg total) by mouth 2 (two) times daily.   lisinopril 10 MG tablet Commonly known as:  PRINIVIL,ZESTRIL Take 10 mg by mouth daily.   metFORMIN 500 MG 24 hr tablet Commonly known as:  GLUCOPHAGE-XR Take 1,000 mg by mouth 2 (two) times daily.   niMODipine 30 MG capsule Commonly known as:  NIMOTOP Take 1 capsule (30 mg total) by mouth every 2 (two) hours for 9 days.      Follow-up Information    Lisbeth Renshaw, MD. Schedule an appointment as soon as possible for a visit in 3 week(s).   Specialty:  Neurosurgery Contact information: [address] [place] [address] [phone]           Signed: Alyson Ingles 03/17/2018, 8:50 AM

## 2018-03-17 NOTE — Progress Notes (Signed)
Transcranial Doppler  Date POD PCO2 HCT BP  MCA ACA PCA OPHT SIPH VERT Basilar  9/30 JE/ CK     Right  Left   49  63   -66  -56   -36  31   21  25    48  -49   -23  -26   -33      10/02 [place]/JE     Right  Left   48  44   -35  -27   34  22   25  25    48  -32   -20  -22   -29      10/4 JE/ MR     Right  Left   51  42   -31  -41   20  33   17  25   38  38   -26  -20   -23      10/7 VS      Right  Left   63  57   -32  -40   -27  40   24  24   51  41   -25  -24   NI      10/9 MS     Right  Left   35  51   -19  -45   -17  31   18  25    67  43   -25  -22   -31           Right  Left                                            Right  Left                                        MCA = Middle Cerebral Artery      OPHT = Opthalmic Artery     BASILAR = Basilar Artery   ACA = Anterior Cerebral Artery     SIPH = Carotid Siphon PCA = Posterior Cerebral Artery   VERT = Verterbral Artery                   Normal MCA = 62+\-12 ACA = 50+\-12 PCA = 42+\-23   Farrel Demark, RDMS, RVT 10/7 [place] ratio Rt - 1.4 Lt 1.63 Basilar not insonated   03/17/18-   Rt [place] ratio= 1.35 Lt [place] ratio= 1.65  03/17/2018 2:47 PM Gertie Fey, MHA, RVT, RDCS, RDMS

## 2018-03-17 NOTE — Progress Notes (Signed)
  NEUROSURGERY PROGRESS NOTE   No issues overnight.  EXAM:  BP (!) 142/74   Pulse (!) 52   Temp 98.2 F (36.8 C) (Oral)   Resp 15   Ht 5\' 11"  (1.803 m)   Wt 103.5 kg   SpO2 95%   BMI 31.82 kg/m   Awake, alert, oriented  Speech fluent, appropriate  CN grossly intact  5/5 BUE/BLE  No drift  IMPRESSION/PLAN 62 y.o. male SAH day #12 s/p ACOM. Neurologically stable. - Pending TCD today. If normal, ok for d/c. Orders signed. Will need nimotop from [facility] pharmacy

## 2018-03-17 NOTE — Care Management Note (Addendum)
Case Management Note  Patient Details  Name: Alfred Marshall MRN: [id number] Date of Birth: Nov 26, 1955  Subjective/Objective:  Alfred Marshall is a 62 y.o. male who came to ER after he had an acute onset 10/10 HA that awoke him from sleep. Associated with dizziness, blurry vision and nausea. 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 Pt underwent coil embolization of aneurysm.  PTA, pt independent, lives alone.                    Action/Plan: PT/OT recommending HH follow up at dc.  Met with pt to discuss dc plans:  He is agreeable to [facility] follow up at dc, and states he has all needed DME from when his mother was ill (ie, RW, BSC, shower chair, etc), if needed.  Pt made aware of recommendation of 24h supervision initially at home, and he states he can hire paid caregivers if this is needed short term.  Pt has no preference for [facility] agency, just wants to make sure they do a good job.  Will refer to [facility] for [facility] follow up of [facility] services.  Will check coverage for Nimotop with pt's insurance prior to dc.  Would recommend hard copy Rx for Nimotop, as will likely need to be filled at [place].  Most pharmacies do not have this medication in stock.    Expected Discharge Date:  03/17/18               Expected Discharge Plan:  [place]  In-House Referral:     Discharge planning Services  CM Consult, Medication Assistance  Post Acute Care Choice:  Home Health Choice offered to:  Patient  DME Arranged:    DME Agency:     HH Arranged:  PT, OT HH Agency:  [place]  Status of Service:  Completed, signed off  If discussed at [place] of Stay Meetings, dates discussed:    Additional Comments:  03/17/17 J. Nylia Gavina, RN, BSN Pt medically stable for discharge pending TCD results.  Notified [facility] of dc home today; start of care 24-48h post dc date.  Spoke with unit pharmacist, who has arranged for meds to be filled through [facility] (Transitions of Care)  Pharmacy; Nimotop copay will be $10, per benefits check.  DC meds to be delivered to bedside, per pharmacist.    Reinaldo Raddle, RN, BSN  Trauma/Neuro ICU Case Manager [phone]

## 2018-03-17 NOTE — Care Management (Signed)
#   11.  S/W PRINCESS @ [facility] # [phone]  1. NIMOTOP 30 MG COVER- : NONE FORMULARY PRIOR APPROVAL- YES  # [phone]  2. NIMODIPINE  30 MG  COVER- YES CO-PAY- $ 10.00 TIER- 1 DRUG PRIOR APPROVAL- NO  PREFERRED PHARMACY : YES [facility] M/O 90 DAY SUPPLY FOR M/O $ 20.00

## 2018-03-19 MED FILL — Pimecrolimus Cream 1%: CUTANEOUS | Qty: 30 | Status: AC

## 2018-08-11 ENCOUNTER — Other Ambulatory Visit (HOSPITAL_COMMUNITY): Payer: Self-pay | Admitting: Neurosurgery

## 2018-08-11 DIAGNOSIS — I609 Nontraumatic subarachnoid hemorrhage, unspecified: Secondary | ICD-10-CM

## 2018-08-26 ENCOUNTER — Other Ambulatory Visit: Payer: Self-pay | Admitting: Neurosurgery

## 2018-10-12 ENCOUNTER — Ambulatory Visit (HOSPITAL_COMMUNITY): Admission: RE | Admit: 2018-10-12 | Payer: 59 | Source: Ambulatory Visit

## 2018-11-04 ENCOUNTER — Other Ambulatory Visit: Payer: Self-pay | Admitting: Neurosurgery

## 2018-11-15 NOTE — H&P (Signed)
Chief Complaint   aneurysm   HPI   HPI: Alfred Marshall is a 63 y.o. 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 He presents today for routine diagnostic angiogram for monitoring. He is without any concerns.  Patient Active Problem List   Diagnosis Date Noted  . Subarachnoid hemorrhage (HCC) 03/06/2018    PMH: No past medical history on file.  PSH: (Not in a hospital admission)   SH: Social History   Tobacco Use  . Smoking status: Current Every Day Smoker  . Smokeless tobacco: Current User    Types: Chew  Substance Use Topics  . Alcohol use: Not Currently  . Drug use: Never    MEDS: Prior to Admission medications   Medication Sig Start Date End Date Taking? Authorizing Provider  levETIRAcetam (KEPPRA) 500 MG tablet Take 1 tablet (500 mg total) by mouth 2 (two) times daily. 03/17/18   Costella, Darci CurrentVincent J, PA-C  lisinopril (PRINIVIL,ZESTRIL) 10 MG tablet Take 10 mg by mouth daily.    [provider]  metFORMIN (GLUCOPHAGE-XR) 500 MG 24 hr tablet Take 1,000 mg by mouth 2 (two) times daily.    [provider]  niMODipine (NIMOTOP) 30 MG capsule Take 1 capsule (30 mg total) by mouth every 2 (two) hours for 9 days. 03/17/18 03/26/18  Costella, Darci CurrentVincent J, PA-C    ALLERGY: Allergies  Allergen Reactions  . Penicillins Other (See Comments)    Childhood allergy     Social History   Tobacco Use  . Smoking status: Current Every Day Smoker  . Smokeless tobacco: Current User    Types: Chew  Substance Use Topics  . Alcohol use: Not Currently     No family history on file.   ROS   ROS  Exam   There were no vitals filed for this visit. General appearance: WDWN, NAD Eyes: No scleral injection Cardiovascular: Regular rate and rhythm without murmurs,  rubs, gallops. No edema or variciosities. Distal pulses normal. Pulmonary: Effort normal, non-labored breathing Musculoskeletal:     Muscle tone upper extremities: Normal    Muscle tone lower extremities: Normal    Motor exam: Upper Extremities Deltoid Bicep Tricep Grip  Right 5/5 5/5 5/5 5/5  Left 5/5 5/5 5/5 5/5   Lower Extremity IP Quad PF DF EHL  Right 5/5 5/5 5/5 5/5 5/5  Left 5/5 5/5 5/5 5/5 5/5   Neurological Mental Status:    - Patient is awake, alert, oriented to person, place, month, year, and situation    - Patient is able to give a clear and coherent history.    - No signs of aphasia or neglect Cranial Nerves    - II: Visual Fields are full. PERRL    - III/IV/VI: EOMI without ptosis or diploplia.     - V: Facial sensation is grossly normal    - VII: Facial movement is symmetric.     - VIII: hearing is intact to voice    - X: Uvula elevates symmetrically    - XI: Shoulder shrug is symmetric.    - XII: tongue is midline without atrophy or fasciculations.  Sensory: Sensation grossly intact to LT  Results - Imaging/Labs   No results found for this or any previous visit (from the past 48 hour(s)).  No results found.  Impression/Plan   62  y.o. male  status post subarachnoid hemorrhage and coiling of a ruptured anterior communicating artery aneurysm. Will proceed with diagnostic angiogram for routine monitoring.  While in the office risks, benefits and alternatives were discussed. Patient stated in own language understanding and wished to proceed.  Ferne Reus, PA-C [facility] [employer]

## 2018-11-16 ENCOUNTER — Other Ambulatory Visit (HOSPITAL_COMMUNITY): Payer: Self-pay | Admitting: Neurosurgery

## 2018-11-16 ENCOUNTER — Other Ambulatory Visit: Payer: Self-pay

## 2018-11-16 ENCOUNTER — Encounter (HOSPITAL_COMMUNITY): Payer: Self-pay

## 2018-11-16 ENCOUNTER — Ambulatory Visit (HOSPITAL_COMMUNITY)
Admission: RE | Admit: 2018-11-16 | Discharge: 2018-11-16 | Disposition: A | Discharge status: Home / Self Care | Payer: 59 | Source: Ambulatory Visit | Attending: Neurosurgery | Admitting: Neurosurgery

## 2018-11-16 DIAGNOSIS — Z880 Allergy status to penicillin: Secondary | ICD-10-CM | POA: Diagnosis not present

## 2018-11-16 DIAGNOSIS — Z7984 Long term (current) use of oral hypoglycemic drugs: Secondary | ICD-10-CM | POA: Diagnosis not present

## 2018-11-16 DIAGNOSIS — I609 Nontraumatic subarachnoid hemorrhage, unspecified: Secondary | ICD-10-CM

## 2018-11-16 DIAGNOSIS — Z79899 Other long term (current) drug therapy: Secondary | ICD-10-CM | POA: Diagnosis not present

## 2018-11-16 DIAGNOSIS — I602 Nontraumatic subarachnoid hemorrhage from anterior communicating artery: Secondary | ICD-10-CM | POA: Insufficient documentation

## 2018-11-16 DIAGNOSIS — F17290 Nicotine dependence, other tobacco product, uncomplicated: Secondary | ICD-10-CM | POA: Diagnosis not present

## 2018-11-16 HISTORY — PX: IR ANGIO INTRA EXTRACRAN SEL INTERNAL CAROTID UNI L MOD SED: IMG5361

## 2018-11-16 HISTORY — PX: IR US GUIDE VASC ACCESS RIGHT: IMG2390

## 2018-11-16 LAB — CBC WITH DIFFERENTIAL/PLATELET
Abs Immature Granulocytes: 0.04 10*3/uL (ref 0.00–0.07)
Basophils Absolute: 0 10*3/uL (ref 0.0–0.1)
Basophils Relative: 1 %
Eosinophils Absolute: 0.2 10*3/uL (ref 0.0–0.5)
Eosinophils Relative: 3 %
HCT: 48.3 % (ref 39.0–52.0)
Hemoglobin: 15.9 g/dL (ref 13.0–17.0)
Immature Granulocytes: 1 %
Lymphocytes Relative: 24 %
Lymphs Abs: 1.5 10*3/uL (ref 0.7–4.0)
MCH: 30.1 pg (ref 26.0–34.0)
MCHC: 32.9 g/dL (ref 30.0–36.0)
MCV: 91.5 fL (ref 80.0–100.0)
Monocytes Absolute: 0.5 10*3/uL (ref 0.1–1.0)
Monocytes Relative: 8 %
Neutro Abs: 4.1 10*3/uL (ref 1.7–7.7)
Neutrophils Relative %: 63 %
Platelets: 235 10*3/uL (ref 150–400)
RBC: 5.28 MIL/uL (ref 4.22–5.81)
RDW: 12.9 % (ref 11.5–15.5)
WBC: 6.5 10*3/uL (ref 4.0–10.5)
nRBC: 0 % (ref 0.0–0.2)

## 2018-11-16 LAB — APTT: aPTT: 31 seconds (ref 24–36)

## 2018-11-16 LAB — BASIC METABOLIC PANEL
Anion gap: 10 (ref 5–15)
BUN: 11 mg/dL (ref 6–20)
CO2: 22 mmol/L (ref 22–32)
Calcium: 9.4 mg/dL (ref 8.9–10.3)
Chloride: 108 mmol/L (ref 98–111)
Creatinine, Ser: 1.05 mg/dL (ref 0.61–1.24)
GFR calc Af Amer: >60 mL/min (ref >60)
GFR calc non Af Amer: >60 mL/min (ref >60)
Glucose, Bld: 135 mg/dL — ABNORMAL HIGH (ref 70–99)
Potassium: 4.1 mmol/L (ref 3.5–5.1)
Sodium: 140 mmol/L (ref 135–145)

## 2018-11-16 LAB — PROTIME-INR
INR: 1 (ref 0.8–1.2)
Prothrombin Time: 12.7 seconds (ref 11.4–15.2)

## 2018-11-16 MED ORDER — FENTANYL CITRATE (PF) 100 MCG/2ML IJ SOLN
INTRAMUSCULAR | Status: AC | PRN
  Administered 2018-11-16: 25 ug via INTRAVENOUS

## 2018-11-16 MED ORDER — FENTANYL CITRATE (PF) 100 MCG/2ML IJ SOLN
INTRAMUSCULAR | Status: AC
  Filled 2018-11-16: qty 2

## 2018-11-16 MED ORDER — LIDOCAINE HCL (PF) 1 % IJ SOLN
INTRAMUSCULAR | Status: AC | PRN
  Administered 2018-11-16: 1 mL

## 2018-11-16 MED ORDER — LIDOCAINE HCL 1 % IJ SOLN
INTRAMUSCULAR | Status: AC
  Filled 2018-11-16: qty 20

## 2018-11-16 MED ORDER — SODIUM CHLORIDE 0.9 % IV SOLN: INTRAVENOUS | Status: DC

## 2018-11-16 MED ORDER — IOHEXOL 300 MG/ML  SOLN
100.0000 mL | Freq: Once | INTRAMUSCULAR | Status: AC | PRN
  Administered 2018-11-16: 30 mL via INTRAVENOUS

## 2018-11-16 MED ORDER — HEPARIN SODIUM (PORCINE) 1000 UNIT/ML IJ SOLN
INTRAMUSCULAR | Status: AC | PRN
  Administered 2018-11-16: 3000 [IU] via INTRAVENOUS

## 2018-11-16 MED ORDER — VERAPAMIL HCL 2.5 MG/ML IV SOLN
INTRAVENOUS | Status: AC
  Administered 2018-11-16: 5 mg
  Filled 2018-11-16: qty 2

## 2018-11-16 MED ORDER — HEPARIN SODIUM (PORCINE) 1000 UNIT/ML IJ SOLN
INTRAMUSCULAR | Status: AC
  Filled 2018-11-16: qty 1

## 2018-11-16 MED ORDER — NITROGLYCERIN 1 MG/10 ML FOR IR/CATH LAB
INTRA_ARTERIAL | Status: AC
  Administered 2018-11-16: 600 ug
  Filled 2018-11-16: qty 10

## 2018-11-16 NOTE — Brief Op Note (Signed)
  NEUROSURGERY BRIEF OPERATIVE NOTE   PREOP DX: SAH, Acom aneurysm  POSTOP DX: Same  PROCEDURE: Diagnostic cerebral angiogram  SURGEON: Dr. Consuella Lose, MD  ANESTHESIA: IV Sedation with Local  EBL: Minimal  SPECIMENS: None  COMPLICATIONS: None  CONDITION: Stable to recovery  FINDINGS (Full report in [facility]): 1. Partial recurrence of Acom aneurysm measuring approximately 3.2x3.5x3.19mm

## 2018-11-16 NOTE — Sedation Documentation (Signed)
Patient stated her ate a few hours ago. No sedation to be given for procedure. MD aware.

## 2018-11-16 NOTE — Discharge Instructions (Signed)
Cerebral Angiogram    A cerebral angiogram is a procedure that is used to examine the blood vessels in the brain and neck. In this procedure, contrast dye is injected through a long, thin tube (catheter) into an artery. X-rays are then taken, which show if there is a blockage or problem in a blood vessel.  Tell a health care provider about:   Any allergies you have, including allergies to shellfish, contrast dye, or iodine   All medicines you are taking, including vitamins, herbs, eye drops, creams, and over-the-counter medicines.   Any problems you or family members have had with anesthetic medicines.   Any blood disorders you have.   Any surgeries you have had.   Any medical conditions you have.   Whether you are pregnant or may be pregnant.   Whether you are currently breastfeeding.  What are the risks?  Generally, this is a safe procedure. However, problems may occur, including:   Damage to surrounding nerves, tissues, or structures.   Blood clot.   Inability to remember what happened (amnesia). This is usually temporary.   Weakness, numbness, speech, or vision problems. This is usually temporary.   Stroke.   Kidney injury.   Bleeding or bruising.   Allergic reaction medicines or dyes.   Infection.  What happens before the procedure?  Staying hydrated  Follow instructions from your health care provider about hydration, which may include:   Up to 2 hours before the procedure - you may continue to drink clear liquids, such as water, clear fruit juice, black coffee, and plain tea.  Eating and drinking restrictions  Follow instructions from your health care provider about eating and drinking, which may include:   8 hours before the procedure - stop eating heavy meals or foods such as meat, fried foods, or fatty foods.   6 hours before the procedure - stop eating light meals or foods, such as toast or cereal.   6 hours before the procedure - stop drinking milk or drinks that contain milk.   2  hours before the procedure - stop drinking clear liquids.  General instructions   Ask your health care provider about:  ? Changing or stopping your regular medicines. This is especially important if you are taking diabetes medicines or blood thinners.  ? Taking medicines such as aspirin or ibuprofen. These medicines can thin your blood. Do not take these medicines before your procedure if your health care provider asks you not to.   You may have blood tests done.   Plan to have someone take you home from the hospital or clinic.   If you will be going home right after the procedure, plan to have someone with you for 24 hours.  What happens during the procedure?   To reduce your risk of infection:  ? Your health care team will wash or sanitize their hands.  ? Your skin will be washed with soap.  ? Hair may be removed from the surgical area.   You will lie on your back on an imaging bed with an X-ray machine around you.   Your head will be secured to the bed with a strap or device to help you keep still.   An IV tube will be inserted into one of your veins.   You will be given one or more of the following:  ? A medicine to help you relax (sedative).  ? A medicine to numb the area (local anesthetic) where the catheter will be   inserted. This is usually your groin, leg, or arm.   Your heart rate and other vital signs will be watched carefully. You may have electrodes placed on your chest.   A small cut (incision) will be made where the catheter will be inserted.   The catheter will be inserted into an artery that leads to the head. You may feel slight pressure.   The catheter will be moved through the body up to your neck and brain. X-ray images will help your health care provider bring the catheter to the correct location.   The dye will be injected into the catheter and will travel to your head or neck area. You may feel a warming or burning sensation or notice a strange taste in your mouth as the dye goes  through your system.   Images will be taken to show how the dye flows through the area.   While the images are being taken, you may be given instructions on breathing, swallowing, moving, or talking.   When the images are finished, the catheter will be slowly removed.   Pressure will be applied to the skin to stop any bleeding. A tight bandage (dressing) or seal will be applied to the skin.   Your IV will be removed.  The procedure may vary among health care providers and hospitals.  What happens after the procedure?   Your blood pressure, heart rate, breathing rate, and blood oxygen level will be monitored until the medicines you were given have worn off.   You will be asked to lie flat for several hours. The arm or leg where the catheter was inserted will need to be kept straight while you are in the recovery room.   The insertion site will be watched for bleeding and you will be checked often.   You will be instructed to drink plenty of fluids. This will help wash the contrast dye out of your system.   Do not drive for 24 hours if you received a sedative.   It is up to you to get the results of your procedure. Ask your health care provider, or the department that is doing the procedure, when your results will be ready.  Summary   A cerebral angiogram is a procedure that is used to examine the blood vessels in the brain and neck.   In this procedure, contrast dye is injected through a long, thin tube (catheter) into an artery. X-rays are then taken, which show if there is a blockage or problem in a blood vessel.   You will be given a sedative to help you relax during the procedure. A local anesthetic will be used to numb the area where the catheter is inserted. You may feel pressure when the catheter is inserted, and you may feel a warm sensation when the dye is injected.   After the procedure, you will be asked to lie flat for several hours. The arm or leg where the catheter was inserted will need  to be kept straight while you are in the recovery room.  This information is not intended to replace advice given to you by your health care provider. Make sure you discuss any questions you have with your health care provider.  Document Released: 10/10/2013 Document Revised: 06/30/2016 Document Reviewed: 06/30/2016  Elsevier Interactive Patient Education  2019 Elsevier Inc[employer]    Moderate Conscious Sedation, Adult, Care After  These instructions provide you with information about caring for yourself after your procedure. Your health care   provider may also give you more specific instructions. Your treatment has been planned according to current medical practices, but problems sometimes occur. Call your health care provider if you have any problems or questions after your procedure.  What can I expect after the procedure?  After your procedure, it is common:   To feel sleepy for several hours.   To feel clumsy and have poor balance for several hours.   To have poor judgment for several hours.   To vomit if you eat too soon.  Follow these instructions at home:  For at least 24 hours after the procedure:     Do not:  ? Participate in activities where you could fall or become injured.  ? Drive.  ? Use heavy machinery.  ? Drink alcohol.  ? Take sleeping pills or medicines that cause drowsiness.  ? Make important decisions or sign legal documents.  ? Take care of children on your own.   Rest.  Eating and drinking   Follow the diet recommended by your health care provider.   If you vomit:  ? Drink water, juice, or soup when you can drink without vomiting.  ? Make sure you have little or no nausea before eating solid foods.  General instructions   Have a responsible adult stay with you until you are awake and alert.   Take over-the-counter and prescription medicines only as told by your health care provider.   If you smoke, do not smoke without supervision.   Keep all follow-up visits as told by your health care  provider. This is important.  Contact a health care provider if:   You keep feeling nauseous or you keep vomiting.   You feel light-headed.   You develop a rash.   You have a fever.  Get help right away if:   You have trouble breathing.  This information is not intended to replace advice given to you by your health care provider. Make sure you discuss any questions you have with your health care provider.  Document Released: 03/16/2013 Document Revised: 10/29/2015 Document Reviewed: 09/15/2015  Elsevier Interactive Patient Education  2019 Elsevier Inc.

## 2018-11-25 ENCOUNTER — Other Ambulatory Visit: Payer: Self-pay | Admitting: Neurosurgery

## 2018-11-25 ENCOUNTER — Other Ambulatory Visit (HOSPITAL_COMMUNITY): Payer: Self-pay | Admitting: Neurosurgery

## 2018-11-25 DIAGNOSIS — I671 Cerebral aneurysm, nonruptured: Secondary | ICD-10-CM

## 2018-11-25 DIAGNOSIS — I609 Nontraumatic subarachnoid hemorrhage, unspecified: Secondary | ICD-10-CM

## 2018-12-29 ENCOUNTER — Other Ambulatory Visit: Payer: Self-pay | Admitting: Neurosurgery

## 2019-01-03 NOTE — Pre-Procedure Instructions (Signed)
[id number] Ginette Otto- [place], [place] - [address] AT [facility] [address] & [address] [place] [id number] Phone: [phone] Fax: [phone]  [facility] [place], [place] - [address] [facility] [address] [address] [facility] [address] [place] [address] Phone: [phone] Fax: [phone]      Your procedure is scheduled on  01-11-19 Tuesday  Report to [facility] Entr[facility] "A" at 0630 A.M., and check in at the Admitting office.  Call this number if you have problems the morning of surgery:  [phone]  Call [phone] if you have any questions prior to your surgery date Monday-Friday 8am-4pm    Remember:  Do not eat or drink after midnight the night before your surgery    Take these medicines the morning of surgery with A SIP OF WATER :none  7 days prior to surgery STOP taking any Aspirin (unless otherwise instructed by your surgeon), Aleve, Naproxen, Ibuprofen, Motrin, Advil, Goody's, BC's, all herbal medications, fish oil, and all vitamins.   WHAT DO I DO ABOUT MY DIABETES MEDICATION?   Marland Kitchen. Do not take oral diabetes medicines (pills) including METFORMIN  the morning of surgery.   How to Manage Your Diabetes Before and After Surgery  Why is it important to control my blood sugar before and after surgery? . Improving blood sugar levels before and after surgery helps healing and can limit problems. . A way of improving blood sugar control is eating a healthy diet by: o  Eating less sugar and carbohydrates o  Increasing activity/exercise o  Talking with your doctor about reaching your blood sugar goals . High blood sugars (greater than 180 mg/dL) can raise your risk of infections and slow your recovery, so you will need to focus on controlling your diabetes during the weeks before surgery. . Make sure that the doctor who takes care of your diabetes knows about your planned surgery including the date and  location.  How do I manage my blood sugar before surgery? . Check your blood sugar at least 4 times a day, starting 2 days before surgery, to make sure that the level is not too high or low. o Check your blood sugar the morning of your surgery when you wake up and every 2 hours until you get to the Short Stay unit. . If your blood sugar is less than 70 mg/dL, you will need to treat for low blood sugar: o Do not take insulin. o Treat a low blood sugar (less than 70 mg/dL) with  cup of clear juice (cranberry or apple), 4 glucose tablets, OR glucose gel. o Recheck blood sugar in 15 minutes after treatment (to make sure it is greater than 70 mg/dL). If your blood sugar is not greater than 70 mg/dL on recheck, call [phone] for further instructions. . Report your blood sugar to the short stay nurse when you get to Short Stay.  . If you are admitted to the hospital after surgery: o Your blood sugar will be checked by the staff and you will probably be given insulin after surgery (instead of oral diabetes medicines) to make sure you have good blood sugar levels. o The goal for blood sugar control after surgery is 80-180 mg/dL.   The Morning of Surgery  Do not wear jewelry  Do not wear lotions, powders, or colognes, or deodorant   Men may shave face and neck.  Do not bring valuables to the hospital.  [facility]  is not responsible for any belongings or valuables.  If you are a smoker, DO NOT Smoke 24 hours prior to surgery IF you wear a CPAP at night please bring your mask, tubing, and machine the morning of surgery   Remember that you must have someone to transport you home after your surgery, and remain with you for 24 hours if you are discharged the same day.   Contacts, glasses, hearing aids, dentures or bridgework may not be worn into surgery.    Leave your suitcase in the car.  After surgery it may be brought to your room.  For patients admitted to the hospital, discharge time  will be determined by your treatment team.  Patients discharged the day of surgery will not be allowed to drive home.    Special instructions:   [facility]- Preparing For Surgery  Before surgery, you can play an important role. Because skin is not sterile, your skin needs to be as free of germs as possible. You can reduce the number of germs on your skin by washing with CHG (chlorahexidine gluconate) Soap before surgery.  CHG is an antiseptic cleaner which kills germs and bonds with the skin to continue killing germs even after washing.    Oral Hygiene is also important to reduce your risk of infection.  Remember - BRUSH YOUR TEETH THE MORNING OF SURGERY WITH YOUR REGULAR TOOTHPASTE  Please do not use if you have an allergy to CHG or antibacterial soaps. If your skin becomes reddened/irritated stop using the CHG.  Do not shave (including legs and underarms) for at least 48 hours prior to first CHG shower. It is OK to shave your face.  Please follow these instructions carefully.   1. Shower the NIGHT BEFORE SURGERY and the MORNING OF SURGERY with CHG Soap.   2. If you chose to wash your hair, wash your hair first as usual with your normal shampoo.  3. After you shampoo, rinse your hair and body thoroughly to remove the shampoo.  4. Use CHG as you would any other liquid soap. You can apply CHG directly to the skin and wash gently with a scrungie or a clean washcloth.   5. Apply the CHG Soap to your body ONLY FROM THE NECK DOWN.  Do not use on open wounds or open sores. Avoid contact with your eyes, ears, mouth and genitals (private parts). Wash Face and genitals (private parts)  with your normal soap.   6. Wash thoroughly, paying special attention to the area where your surgery will be performed.  7. Thoroughly rinse your body with warm water from the neck down.  8. DO NOT shower/wash with your normal soap after using and rinsing off the CHG Soap.  9. Pat yourself dry with a CLEAN  TOWEL.  10. Wear CLEAN PAJAMAS to bed the night before surgery, wear comfortable clothes the morning of surgery  11. Place CLEAN SHEETS on your bed the night of your first shower and DO NOT SLEEP WITH PETS.    Day of Surgery:  Do not apply any deodorants/lotions. Please shower the morning of surgery with the CHG soap  Please wear clean clothes to the hospital/surgery center.   Remember to brush your teeth WITH YOUR REGULAR TOOTHPASTE.   Please read over the following fact sheets that you were given.

## 2019-01-04 ENCOUNTER — Encounter (HOSPITAL_COMMUNITY)
Admission: RE | Admit: 2019-01-04 | Discharge: 2019-01-04 | Disposition: A | Discharge status: Home / Self Care | Payer: 59 | Source: Ambulatory Visit | Attending: Neurosurgery | Admitting: Neurosurgery

## 2019-01-04 ENCOUNTER — Other Ambulatory Visit: Payer: Self-pay

## 2019-01-04 ENCOUNTER — Encounter (HOSPITAL_COMMUNITY): Payer: Self-pay

## 2019-01-04 DIAGNOSIS — Z01818 Encounter for other preprocedural examination: Secondary | ICD-10-CM | POA: Diagnosis not present

## 2019-01-04 DIAGNOSIS — E119 Type 2 diabetes mellitus without complications: Secondary | ICD-10-CM | POA: Insufficient documentation

## 2019-01-04 DIAGNOSIS — Z7982 Long term (current) use of aspirin: Secondary | ICD-10-CM | POA: Insufficient documentation

## 2019-01-04 DIAGNOSIS — Z20828 Contact with and (suspected) exposure to other viral communicable diseases: Secondary | ICD-10-CM | POA: Insufficient documentation

## 2019-01-04 DIAGNOSIS — Z7984 Long term (current) use of oral hypoglycemic drugs: Secondary | ICD-10-CM | POA: Diagnosis not present

## 2019-01-04 DIAGNOSIS — Z7902 Long term (current) use of antithrombotics/antiplatelets: Secondary | ICD-10-CM | POA: Insufficient documentation

## 2019-01-04 DIAGNOSIS — I609 Nontraumatic subarachnoid hemorrhage, unspecified: Secondary | ICD-10-CM | POA: Diagnosis not present

## 2019-01-04 DIAGNOSIS — Z87891 Personal history of nicotine dependence: Secondary | ICD-10-CM | POA: Insufficient documentation

## 2019-01-04 DIAGNOSIS — Z79899 Other long term (current) drug therapy: Secondary | ICD-10-CM | POA: Insufficient documentation

## 2019-01-04 HISTORY — DX: Type 2 diabetes mellitus without complications: E11.9

## 2019-01-04 HISTORY — DX: Nontraumatic subarachnoid hemorrhage, unspecified: I60.9

## 2019-01-04 HISTORY — DX: Unspecified asthma, uncomplicated: J45.909

## 2019-01-04 LAB — COMPREHENSIVE METABOLIC PANEL
ALT: 21 U/L (ref 0–44)
AST: 23 U/L (ref 15–41)
Albumin: 3.9 g/dL (ref 3.5–5.0)
Alkaline Phosphatase: 49 U/L (ref 38–126)
Anion gap: 11 (ref 5–15)
BUN: 11 mg/dL (ref 8–23)
CO2: 22 mmol/L (ref 22–32)
Calcium: 9.4 mg/dL (ref 8.9–10.3)
Chloride: 105 mmol/L (ref 98–111)
Creatinine, Ser: 1.04 mg/dL (ref 0.61–1.24)
GFR calc Af Amer: >60 mL/min (ref >60)
GFR calc non Af Amer: >60 mL/min (ref >60)
Glucose, Bld: 221 mg/dL — ABNORMAL HIGH (ref 70–99)
Potassium: 4 mmol/L (ref 3.5–5.1)
Sodium: 138 mmol/L (ref 135–145)
Total Bilirubin: 0.9 mg/dL (ref 0.3–1.2)
Total Protein: 6.3 g/dL — ABNORMAL LOW (ref 6.5–8.1)

## 2019-01-04 LAB — CBC WITH DIFFERENTIAL/PLATELET
Abs Immature Granulocytes: 0.04 10*3/uL (ref 0.00–0.07)
Basophils Absolute: 0 10*3/uL (ref 0.0–0.1)
Basophils Relative: 0 %
Eosinophils Absolute: 0.2 10*3/uL (ref 0.0–0.5)
Eosinophils Relative: 3 %
HCT: 47.6 % (ref 39.0–52.0)
Hemoglobin: 15.5 g/dL (ref 13.0–17.0)
Immature Granulocytes: 1 %
Lymphocytes Relative: 24 %
Lymphs Abs: 1.7 10*3/uL (ref 0.7–4.0)
MCH: 30.2 pg (ref 26.0–34.0)
MCHC: 32.6 g/dL (ref 30.0–36.0)
MCV: 92.6 fL (ref 80.0–100.0)
Monocytes Absolute: 0.5 10*3/uL (ref 0.1–1.0)
Monocytes Relative: 7 %
Neutro Abs: 4.8 10*3/uL (ref 1.7–7.7)
Neutrophils Relative %: 65 %
Platelets: 209 10*3/uL (ref 150–400)
RBC: 5.14 MIL/uL (ref 4.22–5.81)
RDW: 12.8 % (ref 11.5–15.5)
WBC: 7.2 10*3/uL (ref 4.0–10.5)
nRBC: 0 % (ref 0.0–0.2)

## 2019-01-04 LAB — URINALYSIS, ROUTINE W REFLEX MICROSCOPIC
Bacteria, UA: NONE SEEN
Bilirubin Urine: NEGATIVE
Glucose, UA: >=500 mg/dL — AB
Ketones, ur: NEGATIVE mg/dL
Leukocytes,Ua: NEGATIVE
Nitrite: NEGATIVE
Protein, ur: NEGATIVE mg/dL
Specific Gravity, Urine: 1.025 (ref 1.005–1.030)
pH: 5 (ref 5.0–8.0)

## 2019-01-04 LAB — GLUCOSE, CAPILLARY: Glucose-Capillary: 237 mg/dL — ABNORMAL HIGH (ref 70–99)

## 2019-01-04 LAB — HEMOGLOBIN A1C
Hgb A1c MFr Bld: 7.3 % — ABNORMAL HIGH (ref 4.8–5.6)
Mean Plasma Glucose: 162.81 mg/dL

## 2019-01-04 LAB — PROTIME-INR
INR: 1 (ref 0.8–1.2)
Prothrombin Time: 13.1 seconds (ref 11.4–15.2)

## 2019-01-04 LAB — APTT: aPTT: 29 seconds (ref 24–36)

## 2019-01-04 NOTE — Progress Notes (Signed)
PCP - Dr. Kathryne Eriksson Cardiologist - denies  Chest x-ray - N/A EKG - 03/09/18 Stress Test - pt states a few years ago with Dr. Einar Gip. Requested records.  ECHO - denies Cardiac Cath - denies  Sleep Study - denies CPAP - denies  Checks Blood Sugar about 2 times a month.  CBG at PAT 237. Will obtain a A1C at this visit.  Blood Thinner Instructions: Pt to start plavix today per Dr. Kathyrn Sheriff Aspirin Instructions:Pt is to continue per Dr. Kathyrn Sheriff.   Anesthesia review: Yes, pending ST results from Dr. Einar Gip  Patient denies shortness of breath, fever, cough and chest pain at PAT appointment   Patient verbalized understanding of instructions that were given to them at the PAT appointment. Patient was also instructed that they will need to review over the PAT instructions again at home before surgery.   Pt scheduled for Covid Testing on 01/07/19.    Coronavirus Screening  Have you experienced the following symptoms:  Cough yes/no: No Fever (>100.79F)  yes/no: No Runny nose yes/no: No Sore throat yes/no: No Difficulty breathing/shortness of breath  yes/no: No  Have you or a family member traveled in the last 14 days and where? yes/no: No   If the patient indicates "YES" to the above questions, their PAT will be rescheduled to limit the exposure to others and, the surgeon will be notified. THE PATIENT WILL NEED TO BE ASYMPTOMATIC FOR 14 DAYS.   If the patient is not experiencing any of these symptoms, the PAT nurse will instruct them to NOT bring anyone with them to their appointment since they may have these symptoms or traveled as well.   Please remind your patients and families that hospital visitation restrictions are in effect and the importance of the restrictions.

## 2019-01-05 NOTE — Progress Notes (Signed)
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   He was started on Plavix 01/04/19 and is to continue ASA per Dr. Kathyrn Sheriff.  He denies shortness of breath, fever, cough, chest pain at PAT RN visit. Reviewed case and previous 2017 cardiology notes with anesthesiologist Myrtie Soman, MD. Patient denied CV symptoms and tolerated similar procedure less than a year ago. If no acute changes then it is anticipated that he can proceed as planned. He did have sinus bradycardia initially post-SAH that Dr. Kathyrn Sheriff felt was likely "physiologic." HR was 70-90's at his several encounters [facility] Everywhere) since 04/2018.   Pre-procedure COVID-19 test is scheduled for 01/07/19.   VS: BP 134/76   Pulse 96   Temp (!) 36.2 C (Oral)   Resp 18   Ht 5\' 11"  (1.803 m)   Wt 102.7 kg   SpO2 96%   BMI 31.59 kg/m    PROVIDERS: Alfred Sacramento, MD is PCP (see [place]) -He is not routinely followed by cardiologist but was seen at [facility] in 02/2016. 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 He was not having any CV symptoms. Per 02/14/16 note by Neldon Labella, NP, "Per the requirements of his job," a stress and echo were arranged, but apparently never happened.  He was not having  any chest pain, shortness of breath, PND, orthopnea, edema, dizziness, syncope or claudication symptoms at that time.   LABS: Labs noted. A1c 7.3. (all labs ordered are listed, but only abnormal results are displayed)  Labs Reviewed  GLUCOSE, CAPILLARY - Abnormal; Notable for the following components:      Result Value   Glucose-Capillary 237 (*)    All other components within normal limits  COMPREHENSIVE METABOLIC PANEL - Abnormal; Notable for the following components:   Glucose, Bld 221 (*)    Total Protein 6.3 (*)    All other components within normal limits  HEMOGLOBIN A1C - Abnormal; Notable for the following components:   Hgb A1c MFr Bld 7.3 (*)    All other components within normal limits  URINALYSIS, ROUTINE W REFLEX MICROSCOPIC - Abnormal; Notable for the following components:   Glucose, UA >=500 (*)    Hgb urine dipstick SMALL (*)    All other components within normal limits  APTT  CBC WITH DIFFERENTIAL/PLATELET  PROTIME-INR    EKG: 03/09/18 (during hospitalization post-coiling for SAH, was on nimodipine x 21 dasy for vasospasm prevention): Marked sinus bradycardia at 38 bpm.    CV: N/A   Past Medical History:  Diagnosis Date  . Asthma    as a child  . Diabetes mellitus without complication ([place])   . Subarachnoid hemorrhage [facility])     Past Surgical History:  Procedure Laterality Date  . IR ANGIO INTRA EXTRACRAN SEL INTERNAL CAROTID BILAT MOD SED  03/06/2018  . IR  ANGIO INTRA EXTRACRAN SEL INTERNAL CAROTID UNI L MOD SED  11/16/2018  . IR ANGIO VERTEBRAL SEL VERTEBRAL BILAT MOD SED  03/06/2018  . IR ANGIOGRAM FOLLOW UP STUDY  03/06/2018  . IR ANGIOGRAM FOLLOW UP STUDY  03/06/2018  . IR NEURO EACH ADD'L AFTER BASIC UNI LEFT (MS)  03/06/2018  . IR TRANSCATH/EMBOLIZ  03/06/2018  . IR US GUIDE VASC ACCESS RIGHT  11/16/2018  . RADIOLOGY WITH ANESTHESIA N/A 03/06/2018   Procedure: IR WITH ANESTHESIA:ANGIOGRAM POSSIBLE ANEURYSM COILING;  Surgeon: Radiologist, Medication, MD;   Location: [facility] OR;  Service: Radiology;  Laterality: N/A;    MEDICATIONS: . aspirin EC 325 MG tablet  . atorvastatin (LIPITOR) 20 MG tablet  . clopidogrel (PLAVIX) 75 MG tablet  . levETIRAcetam (KEPPRA) 500 MG tablet  . lisinopril (PRINIVIL,ZESTRIL) 10 MG tablet  . metFORMIN (GLUCOPHAGE-XR) 500 MG 24 hr tablet   No current facility-administered medications for this encounter.   Not currently on Keppra (was on post-SAH).   Alfred ChockAllison Quindon Denker, PA-C Surgical Short Stay/Anesthesiology [facility] Phone [phone] [facility] Phone [phone] 01/05/2019 5:03 PM

## 2019-01-05 NOTE — Anesthesia Preprocedure Evaluation (Addendum)
Anesthesia Evaluation  Patient identified by MRN, date of birth, ID band Patient awake    Reviewed: Allergy & Precautions, NPO status , Patient's Chart, lab work & pertinent test results  History of Anesthesia Complications Negative for: history of anesthetic complications  Airway Mallampati: II  TM Distance: >3 FB Neck ROM: Full    Dental  (+) Partial Upper   Pulmonary Current Smoker and Patient abstained from smoking.,    Pulmonary exam normal        Cardiovascular hypertension, Pt. 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, now with recurrent A-comm aneurysm) negative psych ROS   GI/Hepatic negative GI ROS, Neg liver ROS,   Endo/Other  diabetes, Type 2, Oral Hypoglycemic Agents  Renal/GU negative Renal ROS  negative genitourinary   Musculoskeletal negative musculoskeletal ROS (+)   Abdominal   Peds  Hematology negative hematology ROS (+)   Anesthesia Other Findings   Reproductive/Obstetrics                           Anesthesia Physical Anesthesia Plan  ASA: III  Anesthesia Plan: General   Post-op Pain Management:    Induction: Intravenous  PONV Risk Score and Plan: 2 and Ondansetron, Dexamethasone, Treatment may vary due to age or medical condition and Midazolam  Airway Management Planned: Oral ETT  Additional Equipment: Arterial line  Intra-op Plan:   Post-operative Plan: Extubation in OR  Informed Consent: I have reviewed the patients History and Physical, chart, labs and discussed the procedure including the risks, benefits and alternatives for the proposed anesthesia with the patient or authorized representative who has indicated his/her understanding and acceptance.     Dental advisory given  Plan Discussed with:   Anesthesia Plan Comments: (PAT note written by Myra Gianotti, PA-C. )       Anesthesia Quick Evaluation

## 2019-01-06 ENCOUNTER — Other Ambulatory Visit (HOSPITAL_COMMUNITY): Payer: 59

## 2019-01-07 ENCOUNTER — Other Ambulatory Visit (HOSPITAL_COMMUNITY)
Admission: RE | Admit: 2019-01-07 | Discharge: 2019-01-07 | Disposition: A | Discharge status: Home / Self Care | Payer: 59 | Source: Ambulatory Visit | Attending: Pediatrics | Admitting: Pediatrics

## 2019-01-07 DIAGNOSIS — Z01818 Encounter for other preprocedural examination: Secondary | ICD-10-CM | POA: Diagnosis not present

## 2019-01-08 ENCOUNTER — Emergency Department (HOSPITAL_COMMUNITY): Payer: 59

## 2019-01-08 ENCOUNTER — Emergency Department (HOSPITAL_COMMUNITY)
Admission: EM | Admit: 2019-01-08 | Discharge: 2019-01-08 | Disposition: A | Discharge status: Home / Self Care | Payer: 59 | Attending: Emergency Medicine | Admitting: Emergency Medicine

## 2019-01-08 ENCOUNTER — Other Ambulatory Visit: Payer: Self-pay

## 2019-01-08 ENCOUNTER — Encounter (HOSPITAL_COMMUNITY): Payer: Self-pay

## 2019-01-08 DIAGNOSIS — J45909 Unspecified asthma, uncomplicated: Secondary | ICD-10-CM | POA: Insufficient documentation

## 2019-01-08 DIAGNOSIS — E119 Type 2 diabetes mellitus without complications: Secondary | ICD-10-CM | POA: Diagnosis not present

## 2019-01-08 DIAGNOSIS — F17210 Nicotine dependence, cigarettes, uncomplicated: Secondary | ICD-10-CM | POA: Insufficient documentation

## 2019-01-08 DIAGNOSIS — Z7984 Long term (current) use of oral hypoglycemic drugs: Secondary | ICD-10-CM | POA: Insufficient documentation

## 2019-01-08 DIAGNOSIS — J189 Pneumonia, unspecified organism: Secondary | ICD-10-CM

## 2019-01-08 DIAGNOSIS — Z7901 Long term (current) use of anticoagulants: Secondary | ICD-10-CM | POA: Diagnosis not present

## 2019-01-08 DIAGNOSIS — Z79899 Other long term (current) drug therapy: Secondary | ICD-10-CM | POA: Insufficient documentation

## 2019-01-08 DIAGNOSIS — J181 Lobar pneumonia, unspecified organism: Secondary | ICD-10-CM | POA: Diagnosis not present

## 2019-01-08 DIAGNOSIS — R509 Fever, unspecified: Secondary | ICD-10-CM | POA: Diagnosis present

## 2019-01-08 LAB — BASIC METABOLIC PANEL
Anion gap: 10 (ref 5–15)
BUN: 9 mg/dL (ref 8–23)
CO2: 22 mmol/L (ref 22–32)
Calcium: 8.5 mg/dL — ABNORMAL LOW (ref 8.9–10.3)
Chloride: 95 mmol/L — ABNORMAL LOW (ref 98–111)
Creatinine, Ser: 1.15 mg/dL (ref 0.61–1.24)
GFR calc Af Amer: >60 mL/min (ref >60)
GFR calc non Af Amer: >60 mL/min (ref >60)
Glucose, Bld: 163 mg/dL — ABNORMAL HIGH (ref 70–99)
Potassium: 4.1 mmol/L (ref 3.5–5.1)
Sodium: 127 mmol/L — ABNORMAL LOW (ref 135–145)

## 2019-01-08 LAB — CBC WITH DIFFERENTIAL/PLATELET
Abs Immature Granulocytes: 0.04 10*3/uL (ref 0.00–0.07)
Basophils Absolute: 0 10*3/uL (ref 0.0–0.1)
Basophils Relative: 0 %
Eosinophils Absolute: 0 10*3/uL (ref 0.0–0.5)
Eosinophils Relative: 0 %
HCT: 46.5 % (ref 39.0–52.0)
Hemoglobin: 15.5 g/dL (ref 13.0–17.0)
Immature Granulocytes: 0 %
Lymphocytes Relative: 9 %
Lymphs Abs: 1.1 10*3/uL (ref 0.7–4.0)
MCH: 30.6 pg (ref 26.0–34.0)
MCHC: 33.3 g/dL (ref 30.0–36.0)
MCV: 91.7 fL (ref 80.0–100.0)
Monocytes Absolute: 0.9 10*3/uL (ref 0.1–1.0)
Monocytes Relative: 7 %
Neutro Abs: 10.3 10*3/uL — ABNORMAL HIGH (ref 1.7–7.7)
Neutrophils Relative %: 84 %
Platelets: 190 10*3/uL (ref 150–400)
RBC: 5.07 MIL/uL (ref 4.22–5.81)
RDW: 12.9 % (ref 11.5–15.5)
WBC: 12.3 10*3/uL — ABNORMAL HIGH (ref 4.0–10.5)
nRBC: 0 % (ref 0.0–0.2)

## 2019-01-08 LAB — LACTIC ACID, PLASMA: Lactic Acid, Venous: 0.9 mmol/L (ref 0.5–1.9)

## 2019-01-08 LAB — SARS CORONAVIRUS 2 (TAT 6-24 HRS): SARS Coronavirus 2: NEGATIVE

## 2019-01-08 MED ORDER — SODIUM CHLORIDE 0.9 % IV SOLN
1.0000 g | Freq: Once | INTRAVENOUS | Status: AC
  Administered 2019-01-08: 1 g via INTRAVENOUS
  Filled 2019-01-08: qty 10

## 2019-01-08 MED ORDER — SODIUM CHLORIDE 0.9 % IV SOLN
500.0000 mg | Freq: Once | INTRAVENOUS | Status: AC
  Administered 2019-01-08: 500 mg via INTRAVENOUS
  Filled 2019-01-08: qty 500

## 2019-01-08 MED ORDER — SODIUM CHLORIDE 0.9 % IV SOLN
INTRAVENOUS | Status: DC
  Administered 2019-01-08 (×2): via INTRAVENOUS

## 2019-01-08 MED ORDER — SODIUM CHLORIDE 0.9 % IV BOLUS
500.0000 mL | Freq: Once | INTRAVENOUS | Status: AC
  Administered 2019-01-08: 21:00:00 500 mL via INTRAVENOUS

## 2019-01-08 MED ORDER — ACETAMINOPHEN 325 MG PO TABS
650.0000 mg | ORAL_TABLET | Freq: Once | ORAL | Status: AC
  Administered 2019-01-08: 650 mg via ORAL
  Filled 2019-01-08: qty 2

## 2019-01-08 MED ORDER — AZITHROMYCIN 250 MG PO TABS: 250.0000 mg | ORAL_TABLET | Freq: Every day | ORAL | 0 refills | Status: DC

## 2019-01-08 NOTE — ED Notes (Signed)
Pt sat 90-91 %, when sleeping pt desats to 83-84%, sat is slow to come up with deep breathing.

## 2019-01-08 NOTE — ED Provider Notes (Signed)
[facility] EMERGENCY DEPARTMENT Provider Note   CSN: [id number] Arrival date & time: 01/08/19  1714    History   Chief Complaint Chief Complaint  Patient presents with  . Fever  . Headache    HPI Alfred Marshall is a 63 y.o. male.     Patient had a preoperative COVID test done yesterday.  Test was negative.  Patient was not feeling well yesterday.  He can had chills and fatigue and he actually slept a lot.  Patient was scheduled on August 4 to have stenting of his subarachnoid hemorrhage as follow-up for the coiling that they did back in September 2019 when he had a bleed from an aneurysm.  Patient has a mild headache no severe headache.  Denies any cough or shortness of breath.     Past Medical History:  Diagnosis Date  . Asthma    as a child  . Diabetes mellitus without complication ([place])   . Subarachnoid hemorrhage [facility])     Patient Active Problem List   Diagnosis Date Noted  . Subarachnoid hemorrhage ([place]) 03/06/2018    Past Surgical History:  Procedure Laterality Date  . IR ANGIO INTRA EXTRACRAN SEL INTERNAL CAROTID BILAT MOD SED  03/06/2018  . IR ANGIO INTRA EXTRACRAN SEL INTERNAL CAROTID UNI L MOD SED  11/16/2018  . IR ANGIO VERTEBRAL SEL VERTEBRAL BILAT MOD SED  03/06/2018  . IR ANGIOGRAM FOLLOW UP STUDY  03/06/2018  . IR ANGIOGRAM FOLLOW UP STUDY  03/06/2018  . IR NEURO EACH ADD'L AFTER BASIC UNI LEFT (MS)  03/06/2018  . IR TRANSCATH/EMBOLIZ  03/06/2018  . IR US GUIDE VASC ACCESS RIGHT  11/16/2018  . RADIOLOGY WITH ANESTHESIA N/A 03/06/2018   Procedure: IR WITH ANESTHESIA:ANGIOGRAM POSSIBLE ANEURYSM COILING;  Surgeon: Radiologist, Medication, MD;  Location: [facility];  Service: Radiology;  Laterality: N/A;        Home Medications    Prior to Admission medications   Medication Sig Start Date End Date Taking? Authorizing Provider  aspirin EC 325 MG tablet Take 325 mg by mouth daily. 12/28/18   [provider]  atorvastatin (LIPITOR) 20 MG  tablet Take 20 mg by mouth every evening.  08/19/18   [provider]  azithromycin (ZITHROMAX) 250 MG tablet Take 1 tablet (250 mg total) by mouth daily. Take first 2 tablets together, then 1 every day until finished. 01/08/19   Fredia Sorrow, MD  clopidogrel (PLAVIX) 75 MG tablet Take 75 mg by mouth daily. Starting 01-04-19    [provider]  levETIRAcetam (KEPPRA) 500 MG tablet Take 1 tablet (500 mg total) by mouth 2 (two) times daily. Patient not taking: Reported on 11/15/2018 03/17/18   Traci Sermon, PA-C  lisinopril (PRINIVIL,ZESTRIL) 10 MG tablet Take 10 mg by mouth at bedtime.     [provider]  metFORMIN (GLUCOPHAGE-XR) 500 MG 24 hr tablet Take 1,000 mg by mouth 2 (two) times daily.    [provider]    Family History Family History  Problem Relation Age of Onset  . Congestive Heart Failure Father     Social History Social History   Tobacco Use  . Smoking status: Current Every Day Smoker    Packs/day: 0.25    Types: Cigarettes  . Smokeless tobacco: Never Used  Substance Use Topics  . Alcohol use: Not Currently  . Drug use: Never     Allergies   Penicillins   Review of Systems Review of Systems  Constitutional: Positive for fever.  Negative for chills.  HENT: Negative for congestion, rhinorrhea and sore throat.   Eyes: Negative for visual disturbance.  Respiratory: Negative for cough and shortness of breath.   Cardiovascular: Negative for chest pain and leg swelling.  Gastrointestinal: Negative for abdominal pain, diarrhea, nausea and vomiting.  Genitourinary: Negative for dysuria.  Musculoskeletal: Negative for back pain and neck pain.  Skin: Negative for rash.  Neurological: Positive for headaches. Negative for dizziness and light-headedness.  Hematological: Does not bruise/bleed easily.  Psychiatric/Behavioral: Negative for confusion.     Physical Exam Updated Vital Signs BP (!) 147/83   Pulse 97   Temp (!)  103 F (39.4 C) (Oral)   Resp (!) 26   Ht 1.803 m (5\' 11" )   Wt 100.2 kg   SpO2 (!) 89%   BMI 30.82 kg/m   Physical Exam Vitals signs and nursing note reviewed.  Constitutional:      General: He is not in acute distress.    Appearance: Normal appearance. He is well-developed.  HENT:     Head: Normocephalic and atraumatic.  Eyes:     Extraocular Movements: Extraocular movements intact.     Conjunctiva/sclera: Conjunctivae normal.     Pupils: Pupils are equal, round, and reactive to light.  Neck:     Musculoskeletal: Normal range of motion and neck supple. No neck rigidity.  Cardiovascular:     Rate and Rhythm: Normal rate and regular rhythm.     Heart sounds: No murmur.  Pulmonary:     Effort: Pulmonary effort is normal. No respiratory distress.     Breath sounds: Normal breath sounds.  Abdominal:     Palpations: Abdomen is soft.     Tenderness: There is no abdominal tenderness.  Musculoskeletal: Normal range of motion.        General: No swelling.  Skin:    General: Skin is warm and dry.     Capillary Refill: Capillary refill takes less than 2 seconds.  Neurological:     General: No focal deficit present.     Mental Status: He is alert and oriented to person, place, and time.     Cranial Nerves: No cranial nerve deficit.     Sensory: No sensory deficit.     Motor: No weakness.      ED Treatments / Results  Labs (all labs ordered are listed, but only abnormal results are displayed) Labs Reviewed  CBC WITH DIFFERENTIAL/PLATELET - Abnormal; Notable for the following components:      Result Value   WBC 12.3 (*)    Neutro Abs 10.3 (*)    All other components within normal limits  BASIC METABOLIC PANEL - Abnormal; Notable for the following components:   Sodium 127 (*)    Chloride 95 (*)    Glucose, Bld 163 (*)    Calcium 8.5 (*)    All other components within normal limits  CULTURE, BLOOD (ROUTINE X 2)  CULTURE, BLOOD (ROUTINE X 2)  LACTIC ACID, PLASMA     EKG EKG Interpretation  Date/Time:  Saturday January 08 2019 17:21:32 EDT Ventricular Rate:  92 PR Interval:    QRS Duration: 95 QT Interval:  317 QTC Calculation: 393 R Axis:   58 Text Interpretation:  Sinus rhythm Confirmed by Vanetta MuldersZackowski, Citlalic Norlander [phone]([id number]) on 01/08/2019 5:33:26 PM   Radiology Dg Chest Port 1 View  Result Date: 01/08/2019 CLINICAL DATA:  Fever EXAM: PORTABLE CHEST 1 VIEW COMPARISON:  March 30, 2017 FINDINGS: There is a focal area of airspace  consolidation in the left base. Lungs elsewhere clear. Heart size and pulmonary vascularity are normal. No adenopathy. No bone lesions. IMPRESSION: Focal area of airspace consolidation consistent with pneumonia left base. Lungs elsewhere clear. No adenopathy. Cardiac silhouette within normal limits. Followup PA and lateral chest radiographs recommended in 3-4 weeks following trial of antibiotic therapy to ensure resolution and exclude underlying malignancy. Electronically Signed   By: Bretta BangWilliam  Woodruff III M.D.   On: 01/08/2019 17:48    Procedures Procedures (including critical care time)  Medications Ordered in ED Medications  0.9 %  sodium chloride infusion ( Intravenous Bolus from Bag 01/08/19 1737)  sodium chloride 0.9 % bolus 500 mL (has no administration in time range)  cefTRIAXone (ROCEPHIN) 1 g in sodium chloride 0.9 % 100 mL IVPB (0 g Intravenous Stopped 01/08/19 1928)  azithromycin (ZITHROMAX) 500 mg in sodium chloride 0.9 % 250 mL IVPB (0 mg Intravenous Stopped 01/08/19 2035)  acetaminophen (TYLENOL) tablet 650 mg (650 mg Oral Given 01/08/19 2046)     Initial Impression / Assessment and Plan / ED Course  I have reviewed the triage vital signs and the nursing notes.  Pertinent labs & imaging results that were available during my care of the patient were reviewed by me and considered in my medical decision making (see chart for details).       Patient presenting with new onset of fever.  Patient started to feel not quite well  yesterday.  Patient was scheduled for stenting of the aneurysm by neurosurgery on Tuesday.  Patient had COVID test done yesterday which has come back negative.  So the fever is probably not related to that.  Chest x-ray consistent with left lower lobe pneumonia.  Patient with a dull headache there is no severe headache.  No concerns here for meningitis.  No concerns for head bleed.  Feel that the fever is related to the left lower lobe pneumonia.  This should be a community-acquired pneumonia.  Patient treated here with Rocephin and Zithromax IV is feeling better.  Ambulated here oxygen saturations were 93% to 95% patient did not feel lightheaded did not feel like he was got a pass out did not feel short of breath.  Patient stable for discharge home with close follow-up and will return for any new or worse symptoms.  He will contact his neurosurgeon to cancel the procedure.   Final Clinical Impressions(s) / ED Diagnoses   Final diagnoses:  Community acquired pneumonia of left lower lobe of lung [facility](HCC)    ED Discharge Orders         Ordered    azithromycin (ZITHROMAX) 250 MG tablet  Daily     01/08/19 2051           Vanetta MuldersZackowski, Ki[place] Hollar, MD 01/08/19 2103

## 2019-01-08 NOTE — Discharge Instructions (Addendum)
Contact her neurosurgeon on Monday Opyd need to cancel the procedure.  Chest x-ray consistent with a left lower lobe pneumonia.  Since you were COVID negative yesterday and you were symptomatic this should not be related to COVID-19 infection.  Take the antibiotic as directed.  You should improve over the next couple days.  Return for any new or worse symptoms at all.  Hydrate yourself well at home.  Take Tylenol for the fever.  Take the antibiotic.

## 2019-01-08 NOTE — ED Notes (Signed)
Pt ambulated to BR and back, RA sat 93%

## 2019-01-08 NOTE — ED Triage Notes (Signed)
Pt has had a HA since yesterday with some n/v, oral temp is 102.5 upon arrival. He has a Hx of a brain aneurism & has a planned procedure for a shunt insertion this Tuesday (01/11/19), he states that he had the coil for the shunt already inserted back on March 06, 2018. Pt has already been tested for Covid in preparation for this procedure, no results yet.

## 2019-01-08 NOTE — ED Notes (Signed)
Pt ambulated to bathroom with pulse oximetry. SpO2 maintained between 92% and 95%. Gingerale and diet coke was given. Pt tolerated well.

## 2019-01-11 ENCOUNTER — Ambulatory Visit (HOSPITAL_COMMUNITY): Admission: RE | Admit: 2019-01-11 | Payer: 59 | Source: Ambulatory Visit

## 2019-01-13 LAB — CULTURE, BLOOD (ROUTINE X 2): Culture: NO GROWTH

## 2019-01-21 ENCOUNTER — Other Ambulatory Visit (HOSPITAL_COMMUNITY): Payer: 59

## 2019-01-24 ENCOUNTER — Other Ambulatory Visit (HOSPITAL_COMMUNITY)
Admission: RE | Admit: 2019-01-24 | Discharge: 2019-01-24 | Disposition: A | Discharge status: Home / Self Care | Payer: 59 | Source: Ambulatory Visit | Attending: Neurosurgery | Admitting: Neurosurgery

## 2019-01-24 DIAGNOSIS — Z01812 Encounter for preprocedural laboratory examination: Secondary | ICD-10-CM | POA: Insufficient documentation

## 2019-01-24 DIAGNOSIS — Z20828 Contact with and (suspected) exposure to other viral communicable diseases: Secondary | ICD-10-CM | POA: Insufficient documentation

## 2019-01-24 LAB — SARS CORONAVIRUS 2 (TAT 6-24 HRS): SARS Coronavirus 2: NEGATIVE

## 2019-01-25 ENCOUNTER — Ambulatory Visit (HOSPITAL_COMMUNITY): Admission: RE | Admit: 2019-01-25 | Payer: 59 | Source: Ambulatory Visit

## 2019-01-26 ENCOUNTER — Encounter (HOSPITAL_COMMUNITY): Payer: Self-pay | Admitting: *Deleted

## 2019-01-26 ENCOUNTER — Other Ambulatory Visit: Payer: Self-pay

## 2019-01-26 MED ORDER — VANCOMYCIN HCL 10 G IV SOLR
1500.0000 mg | INTRAVENOUS | Status: AC
  Administered 2019-01-27: 12:00:00 1500 mg via INTRAVENOUS
  Filled 2019-01-26: qty 1500

## 2019-01-26 NOTE — Progress Notes (Signed)
Patient denies shortness of breath, fever, cough and chest pain  PCP - Dr. Kathryne Eriksson Cardiologist - denies  Chest x-ray - N/A EKG - 03/09/18 Stress Test - ECHO - denies Cardiac Cath - denies  Sleep Study - denies CPAP - denies Pt stated that he does not check CBG. Pt made aware to hold Metformin on DOS. Pt made aware to stop taking vitamins, fish oil and herbal medications. Do not take any NSAIDs ie: Ibuprofen, Advil, Naproxen (Aleve), Motrin, BC and Goody Powder.  Blood Thinner Instructions: Plavix and Aspirinto be continued   Coronavirus Screening  Have you experienced the following symptoms:  Cough yes/no: No Fever (>100.36F)  yes/no: No Runny nose yes/no: No Sore throat yes/no: No Difficulty breathing/shortness of breath  yes/no: No  Have you or a family member traveled in the last 14 days and where? yes/no: No  Pt reminded hospital visitation restrictions are in effect and the importance of the restrictions.  Patient verbalized understanding of all pre-op instructions. See anesthesia note.

## 2019-01-26 NOTE — H&P (Addendum)
Chief Complaint   Aneurysm   HPI   HPI: Romie JumperJerome Griffith is a 63 y.o. 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 He underwent routine follow-up diagnostic cerebral angiogram which revealed recannulization of the main portion of the aneurysm.  Given his history was recommended he undergo repeat treatment.  He presents today for pipeline embolization of recurrent A-comm aneurysm.  He has been taking aspirin and Plavix daily for the past 7 days.  He is without any concerns.  Patient Active Problem List   Diagnosis Date Noted  . Subarachnoid hemorrhage (HCC) 03/06/2018    PMH: Past Medical History:  Diagnosis Date  . Asthma    as a child  . Diabetes mellitus without complication (HCC)   . Subarachnoid hemorrhage (HCC)     PSH: Past Surgical History:  Procedure Laterality Date  . IR ANGIO INTRA EXTRACRAN SEL INTERNAL CAROTID BILAT MOD SED  03/06/2018  . IR ANGIO INTRA EXTRACRAN SEL INTERNAL CAROTID UNI L MOD SED  11/16/2018  . IR ANGIO VERTEBRAL SEL VERTEBRAL BILAT MOD SED  03/06/2018  . IR ANGIOGRAM FOLLOW UP STUDY  03/06/2018  . IR ANGIOGRAM FOLLOW UP STUDY  03/06/2018  . IR NEURO EACH ADD'L AFTER BASIC UNI LEFT (MS)  03/06/2018  . IR TRANSCATH/EMBOLIZ  03/06/2018  . IR US GUIDE VASC ACCESS RIGHT  11/16/2018  . RADIOLOGY WITH ANESTHESIA N/A 03/06/2018   Procedure: IR WITH ANESTHESIA:ANGIOGRAM POSSIBLE ANEURYSM COILING;  Surgeon: Radiologist, Medication, MD;  Location: [facility] OR;  Service: Radiology;  Laterality: N/A;    No medications prior to admission.    SH: Social History   Tobacco Use  . Smoking status: Current Every Day Smoker    Packs/day: 0.25    Types: Cigarettes  . Smokeless tobacco: Never Used  Substance Use Topics  . Alcohol use: Not Currently  . Drug use: Never    MEDS: Prior to Admission medications   Medication Sig Start Date End Date  Taking? Authorizing Provider  aspirin EC 325 MG tablet Take 325 mg by mouth daily. 12/28/18   [provider]  atorvastatin (LIPITOR) 20 MG tablet Take 20 mg by mouth every evening.  08/19/18   [provider]  azithromycin (ZITHROMAX) 250 MG tablet Take 1 tablet (250 mg total) by mouth daily. Take first 2 tablets together, then 1 every day until finished. 01/08/19   Vanetta MuldersZackowski, Scott, MD  clopidogrel (PLAVIX) 75 MG tablet Take 75 mg by mouth daily. Starting 01-04-19    [provider]  levETIRAcetam (KEPPRA) 500 MG tablet Take 1 tablet (500 mg total) by mouth 2 (two) times daily. Patient not taking: Reported on 11/15/2018 03/17/18   Alyson Inglesostella, Trisha Morandi J, PA-C  lisinopril (PRINIVIL,ZESTRIL) 10 MG tablet Take 10 mg by mouth at bedtime.     [provider]  metFORMIN (GLUCOPHAGE-XR) 500 MG 24 hr tablet Take 1,000 mg by mouth 2 (two) times daily.    [provider]    ALLERGY: Allergies  Allergen Reactions  . Penicillins Other (See Comments)    Childhood allergy  Did it involve swelling of the face/tongue/throat, SOB, or low BP? Unknown Did it involve sudden or severe rash/hives, skin peeling, or any reaction on the inside of your mouth or nose? Unknown Did you need to seek medical attention at a hospital or doctor's office? Unknown When did it last happen?childhood  If all above answers are "NO", may proceed  with cephalosporin use.     Social History   Tobacco Use  . Smoking status: Current Every Day Smoker    Packs/day: 0.25    Types: Cigarettes  . Smokeless tobacco: Never Used  Substance Use Topics  . Alcohol use: Not Currently     Family History  Problem Relation Age of Onset  . Congestive Heart Failure Father      ROS   ROS  Exam   There were no vitals filed for this visit. General appearance: WDWN, NAD Eyes: No scleral injection Cardiovascular: Regular rate and rhythm without murmurs, rubs, gallops. No edema or  variciosities. Distal pulses normal. Pulmonary: Effort normal, non-labored breathing Musculoskeletal:     Muscle tone upper extremities: Normal    Muscle tone lower extremities: Normal    Motor exam: Upper Extremities Deltoid Bicep Tricep Grip  Right 5/5 5/5 5/5 5/5  Left 5/5 5/5 5/5 5/5   Lower Extremity IP Quad PF DF EHL  Right 5/5 5/5 5/5 5/5 5/5  Left 5/5 5/5 5/5 5/5 5/5   Neurological Mental Status:    - Patient is awake, alert, oriented to person, place, month, year, and situation    - Patient is able to give a clear and coherent history.    - No signs of aphasia or neglect Cranial Nerves    - II: Visual Fields are full. PERRL    - III/IV/VI: EOMI without ptosis or diploplia.     - V: Facial sensation is grossly normal    - VII: Facial movement is symmetric.     - VIII: hearing is intact to voice    - X: Uvula elevates symmetrically    - XI: Shoulder shrug is symmetric.    - XII: tongue is midline without atrophy or fasciculations.  Sensory: Sensation grossly intact to LT  Results - Imaging/Labs   Results for orders placed or performed during the hospital encounter of 01/24/19 (from the past 48 hour(s))  SARS CORONAVIRUS 2 Nasal Swab Aptima Multi Swab     Status: None   Collection Time: 01/24/19 12:53 PM   Specimen: Aptima Multi Swab; Nasal Swab  Result Value Ref Range   SARS Coronavirus 2 NEGATIVE NEGATIVE    Comment: (NOTE) SARS-CoV-2 target nucleic acids are NOT DETECTED. The SARS-CoV-2 RNA is generally detectable in upper and lower respiratory specimens during the acute phase of infection. Negative results do not preclude SARS-CoV-2 infection, do not rule out co-infections with other pathogens, and should not be used as the sole basis for treatment or other patient management decisions. Negative results must be combined with clinical observations, patient history, and epidemiological information. The expected result is Negative. Fact Sheet for Patients:  HairSlick.nohttps://www.fda.gov/media/138098/download Fact Sheet for Healthcare Providers: quierodirigir.comhttps://www.fda.gov/media/138095/download This test is not yet approved or cleared by the [place] FDA and  has been authorized for detection and/or diagnosis of SARS-CoV-2 by FDA under an Emergency Use Authorization (EUA). This EUA will remain  in effect (meaning this test can be used) for the duration of the COVID-19 declaration under Section 56 4(b)(1) of the Act, 21 U.S.C. section 360bbb-3(b)(1), unless the authorization is terminated or revoked sooner. Performed at [facility], [address] [place], [place] [address]     No results found.  IMAGING: Angiogram dated 11/16/2018 was reviewed and compared to initial angiogram of September 2019.  This again demonstrates coil mass in the region of the anterior communicating artery aneurysm.  While the daughter sac of the aneurysm remains  occluded, there has been recanalization of the main portion of the aneurysm at the left A1 A2 junction.  Of note, in review of the prior angiogram, the aneurysm does not fill from the right-sided injection.  Impression/Plan   63 y.o. male   With recannulization of previously coiled ruptured anterior communicating artery aneurysm now nearly a year status post initial treatment.  We will proceed with pipeline embolization of the recurrent anterior communicating artery aneurysm.  He has been taking aspirin and Plavix for the past 7 days.  While in the office risks, benefits and alternatives were discussed.  Patient stated understanding and wished to proceed.    Ferne Reus, PA-C [facility] [employer]

## 2019-01-27 ENCOUNTER — Inpatient Hospital Stay (HOSPITAL_COMMUNITY): Payer: 59 | Admitting: Certified Registered Nurse Anesthetist

## 2019-01-27 ENCOUNTER — Inpatient Hospital Stay (HOSPITAL_COMMUNITY): Payer: 59 | Admitting: Vascular Surgery

## 2019-01-27 ENCOUNTER — Encounter (HOSPITAL_COMMUNITY): Payer: Self-pay

## 2019-01-27 ENCOUNTER — Inpatient Hospital Stay (HOSPITAL_COMMUNITY)
Admission: RE | Admit: 2019-01-27 | Discharge: 2019-01-28 | DRG: 027 | Disposition: A | Discharge status: Home / Self Care | Payer: 59 | Attending: Neurosurgery | Admitting: Neurosurgery

## 2019-01-27 ENCOUNTER — Ambulatory Visit (HOSPITAL_COMMUNITY)
Admission: RE | Admit: 2019-01-27 | Discharge: 2019-01-27 | Disposition: A | Discharge status: Home / Self Care | Payer: 59 | Source: Ambulatory Visit | Attending: Neurosurgery | Admitting: Neurosurgery

## 2019-01-27 ENCOUNTER — Other Ambulatory Visit: Payer: Self-pay

## 2019-01-27 ENCOUNTER — Encounter (HOSPITAL_COMMUNITY)
Admission: RE | Disposition: A | Discharge status: Home / Self Care | Payer: Self-pay | Source: Home / Self Care | Attending: Neurosurgery

## 2019-01-27 DIAGNOSIS — I69098 Other sequelae following nontraumatic subarachnoid hemorrhage: Secondary | ICD-10-CM | POA: Diagnosis not present

## 2019-01-27 DIAGNOSIS — Z79899 Other long term (current) drug therapy: Secondary | ICD-10-CM

## 2019-01-27 DIAGNOSIS — E119 Type 2 diabetes mellitus without complications: Secondary | ICD-10-CM | POA: Diagnosis present

## 2019-01-27 DIAGNOSIS — Z880 Allergy status to penicillin: Secondary | ICD-10-CM

## 2019-01-27 DIAGNOSIS — I671 Cerebral aneurysm, nonruptured: Secondary | ICD-10-CM

## 2019-01-27 DIAGNOSIS — I609 Nontraumatic subarachnoid hemorrhage, unspecified: Secondary | ICD-10-CM

## 2019-01-27 DIAGNOSIS — Z7984 Long term (current) use of oral hypoglycemic drugs: Secondary | ICD-10-CM | POA: Diagnosis not present

## 2019-01-27 DIAGNOSIS — F17210 Nicotine dependence, cigarettes, uncomplicated: Secondary | ICD-10-CM | POA: Diagnosis present

## 2019-01-27 HISTORY — PX: IR TRANSCATH/EMBOLIZ: IMG695

## 2019-01-27 HISTORY — DX: Pneumonia, unspecified organism: J18.9

## 2019-01-27 HISTORY — PX: RADIOLOGY WITH ANESTHESIA: SHX6223

## 2019-01-27 HISTORY — PX: IR NEURO EACH ADD'L AFTER BASIC UNI LEFT (MS): IMG5373

## 2019-01-27 HISTORY — PX: IR ANGIOGRAM FOLLOW UP STUDY: IMG697

## 2019-01-27 HISTORY — PX: IR ANGIO INTRA EXTRACRAN SEL INTERNAL CAROTID UNI L MOD SED: IMG5361

## 2019-01-27 LAB — BASIC METABOLIC PANEL
Anion gap: 12 (ref 5–15)
BUN: 16 mg/dL (ref 8–23)
CO2: 20 mmol/L — ABNORMAL LOW (ref 22–32)
Calcium: 9.8 mg/dL (ref 8.9–10.3)
Chloride: 105 mmol/L (ref 98–111)
Creatinine, Ser: 1 mg/dL (ref 0.61–1.24)
GFR calc Af Amer: >60 mL/min (ref >60)
GFR calc non Af Amer: >60 mL/min (ref >60)
Glucose, Bld: 129 mg/dL — ABNORMAL HIGH (ref 70–99)
Potassium: 4.4 mmol/L (ref 3.5–5.1)
Sodium: 137 mmol/L (ref 135–145)

## 2019-01-27 LAB — MRSA PCR SCREENING: MRSA by PCR: NEGATIVE

## 2019-01-27 LAB — HEMOGLOBIN: Hemoglobin: 15.7 g/dL (ref 13.0–17.0)

## 2019-01-27 LAB — GLUCOSE, CAPILLARY
Glucose-Capillary: 135 mg/dL — ABNORMAL HIGH (ref 70–99)
Glucose-Capillary: 157 mg/dL — ABNORMAL HIGH (ref 70–99)

## 2019-01-27 SURGERY — IR WITH ANESTHESIA
Anesthesia: General

## 2019-01-27 MED ORDER — IOHEXOL 300 MG/ML  SOLN
150.0000 mL | Freq: Once | INTRAMUSCULAR | Status: AC | PRN
  Administered 2019-01-27: 25 mL via INTRA_ARTERIAL

## 2019-01-27 MED ORDER — LABETALOL HCL 5 MG/ML IV SOLN
INTRAVENOUS | Status: DC | PRN
  Administered 2019-01-27: 5 mg via INTRAVENOUS

## 2019-01-27 MED ORDER — CHLORHEXIDINE GLUCONATE CLOTH 2 % EX PADS: 6.0000 | MEDICATED_PAD | Freq: Once | CUTANEOUS | Status: DC

## 2019-01-27 MED ORDER — CLOPIDOGREL BISULFATE 75 MG PO TABS
ORAL_TABLET | ORAL | Status: AC
  Filled 2019-01-27: qty 1

## 2019-01-27 MED ORDER — HYDROCODONE-ACETAMINOPHEN 5-325 MG PO TABS: 1.0000 | ORAL_TABLET | ORAL | Status: DC | PRN

## 2019-01-27 MED ORDER — CLOPIDOGREL BISULFATE 75 MG PO TABS
75.0000 mg | ORAL_TABLET | Freq: Every day | ORAL | Status: DC
  Administered 2019-01-28: 10:00:00 75 mg via ORAL
  Filled 2019-01-27: qty 1

## 2019-01-27 MED ORDER — PANTOPRAZOLE SODIUM 40 MG IV SOLR
40.0000 mg | Freq: Every day | INTRAVENOUS | Status: DC
  Administered 2019-01-27: 23:00:00 40 mg via INTRAVENOUS
  Filled 2019-01-27: qty 40

## 2019-01-27 MED ORDER — CLOPIDOGREL BISULFATE 75 MG PO TABS
75.0000 mg | ORAL_TABLET | Freq: Every day | ORAL | Status: DC
  Administered 2019-01-27: 14:00:00 75 mg via ORAL

## 2019-01-27 MED ORDER — ONDANSETRON HCL 4 MG PO TABS: 4.0000 mg | ORAL_TABLET | ORAL | Status: DC | PRN

## 2019-01-27 MED ORDER — ROCURONIUM BROMIDE 50 MG/5ML IV SOSY
PREFILLED_SYRINGE | INTRAVENOUS | Status: DC | PRN
  Administered 2019-01-27: 50 mg via INTRAVENOUS
  Administered 2019-01-27 (×2): 25 mg via INTRAVENOUS

## 2019-01-27 MED ORDER — SODIUM CHLORIDE 0.9 % IV SOLN
INTRAVENOUS | Status: DC
  Administered 2019-01-27 (×2): via INTRAVENOUS

## 2019-01-27 MED ORDER — LIDOCAINE 2% (20 MG/ML) 5 ML SYRINGE
INTRAMUSCULAR | Status: DC | PRN
  Administered 2019-01-27: 50 mg via INTRAVENOUS

## 2019-01-27 MED ORDER — FENTANYL CITRATE (PF) 100 MCG/2ML IJ SOLN: 25.0000 ug | INTRAMUSCULAR | Status: DC | PRN

## 2019-01-27 MED ORDER — LISINOPRIL 10 MG PO TABS
10.0000 mg | ORAL_TABLET | Freq: Every day | ORAL | Status: DC
  Administered 2019-01-27: 10 mg via ORAL
  Filled 2019-01-27: qty 1

## 2019-01-27 MED ORDER — ASPIRIN 325 MG PO TABS
325.0000 mg | ORAL_TABLET | Freq: Every day | ORAL | Status: DC
  Administered 2019-01-27: 325 mg via ORAL

## 2019-01-27 MED ORDER — LABETALOL HCL 5 MG/ML IV SOLN: 10.0000 mg | INTRAVENOUS | Status: DC | PRN

## 2019-01-27 MED ORDER — ONDANSETRON HCL 4 MG/2ML IJ SOLN: 4.0000 mg | INTRAMUSCULAR | Status: DC | PRN

## 2019-01-27 MED ORDER — ASPIRIN EC 325 MG PO TBEC
325.0000 mg | DELAYED_RELEASE_TABLET | Freq: Every day | ORAL | Status: DC
  Administered 2019-01-28: 325 mg via ORAL
  Filled 2019-01-27: qty 1

## 2019-01-27 MED ORDER — HEPARIN SODIUM (PORCINE) 1000 UNIT/ML IJ SOLN
INTRAMUSCULAR | Status: DC | PRN
  Administered 2019-01-27: 5000 [IU] via INTRAVENOUS

## 2019-01-27 MED ORDER — PROPOFOL 10 MG/ML IV BOLUS
INTRAVENOUS | Status: DC | PRN
  Administered 2019-01-27: 180 mg via INTRAVENOUS

## 2019-01-27 MED ORDER — ACETAMINOPHEN 325 MG PO TABS: 650.0000 mg | ORAL_TABLET | ORAL | Status: DC | PRN

## 2019-01-27 MED ORDER — SUGAMMADEX SODIUM 200 MG/2ML IV SOLN
INTRAVENOUS | Status: DC | PRN
  Administered 2019-01-27: 200 mg via INTRAVENOUS

## 2019-01-27 MED ORDER — DEXAMETHASONE SODIUM PHOSPHATE 10 MG/ML IJ SOLN
INTRAMUSCULAR | Status: DC | PRN
  Administered 2019-01-27: 4 mg via INTRAVENOUS

## 2019-01-27 MED ORDER — OXYCODONE HCL 5 MG PO TABS: 5.0000 mg | ORAL_TABLET | Freq: Once | ORAL | Status: DC | PRN

## 2019-01-27 MED ORDER — FENTANYL CITRATE (PF) 100 MCG/2ML IJ SOLN
INTRAMUSCULAR | Status: AC
  Filled 2019-01-27: qty 2

## 2019-01-27 MED ORDER — METFORMIN HCL 500 MG PO TABS
500.0000 mg | ORAL_TABLET | Freq: Two times a day (BID) | ORAL | Status: DC
  Filled 2019-01-27: qty 1

## 2019-01-27 MED ORDER — ASPIRIN 325 MG PO TABS
ORAL_TABLET | ORAL | Status: AC
  Filled 2019-01-27: qty 1

## 2019-01-27 MED ORDER — IOHEXOL 300 MG/ML  SOLN: 150.0000 mL | Freq: Once | INTRAMUSCULAR | Status: DC | PRN

## 2019-01-27 MED ORDER — SODIUM CHLORIDE 0.9 % IV SOLN
INTRAVENOUS | Status: DC
  Administered 2019-01-27: 21:00:00 via INTRAVENOUS

## 2019-01-27 MED ORDER — ONDANSETRON HCL 4 MG/2ML IJ SOLN
INTRAMUSCULAR | Status: DC | PRN
  Administered 2019-01-27: 4 mg via INTRAVENOUS

## 2019-01-27 MED ORDER — FENTANYL CITRATE (PF) 100 MCG/2ML IJ SOLN
INTRAMUSCULAR | Status: DC | PRN
  Administered 2019-01-27: 100 ug via INTRAVENOUS

## 2019-01-27 MED ORDER — ACETAMINOPHEN 650 MG RE SUPP: 650.0000 mg | RECTAL | Status: DC | PRN

## 2019-01-27 MED ORDER — ONDANSETRON HCL 4 MG/2ML IJ SOLN: 4.0000 mg | Freq: Once | INTRAMUSCULAR | Status: DC | PRN

## 2019-01-27 MED ORDER — INSULIN ASPART 100 UNIT/ML ~~LOC~~ SOLN
0.0000 [IU] | Freq: Three times a day (TID) | SUBCUTANEOUS | Status: DC
  Administered 2019-01-28: 2 [IU] via SUBCUTANEOUS

## 2019-01-27 MED ORDER — METFORMIN HCL ER 500 MG PO TB24
1000.0000 mg | ORAL_TABLET | Freq: Two times a day (BID) | ORAL | Status: DC
  Filled 2019-01-27: qty 2

## 2019-01-27 MED ORDER — OXYCODONE HCL 5 MG/5ML PO SOLN: 5.0000 mg | Freq: Once | ORAL | Status: DC | PRN

## 2019-01-27 MED ORDER — ATORVASTATIN CALCIUM 10 MG PO TABS
20.0000 mg | ORAL_TABLET | Freq: Every day | ORAL | Status: DC
  Administered 2019-01-27 – 2019-01-28 (×2): 20 mg via ORAL
  Filled 2019-01-27 (×2): qty 2

## 2019-01-27 NOTE — Sedation Documentation (Signed)
Right groin sheath removed, 7fr exoseal closure device used.  

## 2019-01-27 NOTE — Anesthesia Procedure Notes (Signed)
Arterial Line Insertion Start/End8/20/2020 1:30 PM, 01/27/2019 1:45 PM Performed by: Lidia Collum, MD, anesthesiologist  Patient location: Pre-op. Preanesthetic checklist: patient identified, IV checked, site marked, risks and benefits discussed, surgical consent, monitors and equipment checked, pre-op evaluation, timeout performed and anesthesia consent Lidocaine 1% used for infiltration Right, radial was placed Catheter size: 20 G Hand hygiene performed  and maximum sterile barriers used  Allen's test indicative of satisfactory collateral circulation Attempts: 3 Procedure performed using ultrasound guided technique. Ultrasound Notes:anatomy identified, needle tip was noted to be adjacent to the nerve/plexus identified and no ultrasound evidence of intravascular and/or intraneural injection Following insertion, dressing applied and Biopatch. Post procedure assessment: normal  Patient tolerated the procedure well with no immediate complications.

## 2019-01-27 NOTE — Brief Op Note (Signed)
  NEUROSURGERY BRIEF OPERATIVE  NOTE   PREOP DX: Acom aneurysm  POSTOP DX: Same  PROCEDURE: Pipeline embolization Acom aneurysm  SURGEON: Dr. Consuella Lose, MD  ANESTHESIA: GETA  EBL: Minimal  SPECIMENS: None  COMPLICATIONS: None  CONDITION: Stable to recovery  FINDINGS (Full report in [facility]): 1. Successful Pipeline embolization of Acom aneurysm without immediate local thrombotic or distal embolic complications noted.

## 2019-01-27 NOTE — Anesthesia Procedure Notes (Signed)
Procedure Name: Intubation Date/Time: 01/27/2019 2:12 PM Performed by: Inda Coke, CRNA Pre-anesthesia Checklist: Patient identified, Emergency Drugs available, Suction available and Patient being monitored Patient Re-evaluated:Patient Re-evaluated prior to induction Oxygen Delivery Method: Circle System Utilized Preoxygenation: Pre-oxygenation with 100% oxygen Induction Type: IV induction Ventilation: Mask ventilation without difficulty and Oral airway inserted - appropriate to patient size Laryngoscope Size: McGraph and 4 Grade View: Grade I Tube type: Oral Tube size: 7.5 mm Number of attempts: 1 Airway Equipment and Method: Stylet and Oral airway Placement Confirmation: ETT inserted through vocal cords under direct vision,  positive ETCO2 and breath sounds checked- equal and bilateral Secured at: 24 cm Tube secured with: Tape Dental Injury: Teeth and Oropharynx as per pre-operative assessment  Future Recommendations: Recommend- induction with short-acting agent, and alternative techniques readily available Comments: DL X 1 with MAC 4 blade and grade III view. DL X 2 with mcgrath 4 blade and grade I view and successful intubation

## 2019-01-27 NOTE — Transfer of Care (Signed)
Immediate Anesthesia Transfer of Care Note  Patient: Alfred Marshall  Procedure(s) Performed: Arteriogram, Pipeline embolization of aneurysm (N/A )  Patient Location: PACU  Anesthesia Type:General  Level of Consciousness: awake and alert   Airway & Oxygen Therapy: Patient Spontanous Breathing and Patient connected to face mask oxygen  Post-op Assessment: Report given to RN and Post -op Vital signs reviewed and stable  Post vital signs: Reviewed and stable  Last Vitals:  Vitals Value Taken Time  BP 108/68 01/27/19 1629  Temp    Pulse 76 01/27/19 1636  Resp 12 01/27/19 1636  SpO2 98 % 01/27/19 1636  Vitals shown include unvalidated device data.  Last Pain:  Vitals:   01/27/19 1156  TempSrc: Oral         Complications: No apparent anesthesia complications

## 2019-01-27 NOTE — Anesthesia Postprocedure Evaluation (Signed)
Anesthesia Post Note  Patient: Alfred Marshall  Procedure(s) Performed: Arteriogram, Pipeline embolization of aneurysm (N/A )     Patient location during evaluation: PACU Anesthesia Type: General Level of consciousness: awake and alert Pain management: pain level controlled Vital Signs Assessment: post-procedure vital signs reviewed and stable Respiratory status: spontaneous breathing, nonlabored ventilation, respiratory function stable and patient connected to nasal cannula oxygen Cardiovascular status: blood pressure returned to baseline and stable Postop Assessment: no apparent nausea or vomiting Anesthetic complications: no    Last Vitals:  Vitals:   01/27/19 1714 01/27/19 1738  BP: 109/75 120/84  Pulse: 73 72  Resp: 12 12  Temp:    SpO2: 93% 94%    Last Pain:  Vitals:   01/27/19 1659  TempSrc:   PainSc: 0-No pain                 Shazia Mitchener COKER

## 2019-01-28 ENCOUNTER — Encounter (HOSPITAL_COMMUNITY): Payer: Self-pay | Admitting: Neurosurgery

## 2019-01-28 LAB — GLUCOSE, CAPILLARY: Glucose-Capillary: 137 mg/dL — ABNORMAL HIGH (ref 70–99)

## 2019-01-28 NOTE — Discharge Summary (Signed)
Physician Discharge Summary  Patient ID: Alfred Marshall MRN: [id number] DOB/AGE: 07/20/1955 63 y.o.  Admit date: 01/27/2019 Discharge date: 01/28/2019  Admission Diagnoses:  Unruptured ACOM aneurysm   Discharge Diagnoses:  Same Active Problems:   Cerebral aneurysm, nonruptured   Discharged Condition: Stable  Hospital Course:  Alfred Marshall is a 63 y.o. male who was admitted for the below procedure. There were no post operative complications. At time of discharge, pain was well controlled, ambulating, tolerating po, voiding normal. Ready for discharge.  Treatments: Surgery - pipeline embolization acom aneurysm  Discharge Exam: Blood pressure (!) 126/91, pulse 62, temperature 98.2 F (36.8 C), temperature source Oral, resp. rate (!) 21, height 5\' 11"  (1.803 m), weight 97.1 kg, SpO2 97 %. Awake, alert, oriented Speech fluent, appropriate CN grossly intact 5/5 BUE/BLE Wound c/d/i  Disposition: Discharge disposition: 01-Home or Self Care       Discharge Instructions    Call MD for:  difficulty breathing, headache or visual disturbances   Complete by: As directed    Call MD for:  persistant dizziness or light-headedness   Complete by: As directed    Call MD for:  redness, tenderness, or signs of infection (pain, swelling, redness, odor or green/yellow discharge around incision site)   Complete by: As directed    Call MD for:  severe uncontrolled pain   Complete by: As directed    Call MD for:  temperature >100.4   Complete by: As directed    Diet general   Complete by: As directed    Driving Restrictions   Complete by: As directed    Do not drive until given clearance.   Increase activity slowly   Complete by: As directed    Lifting restrictions   Complete by: As directed    Do not lift anything >10lbs. Avoid bending and twisting in awkward positions. Avoid bending at the back.   May shower / Bathe   Complete by: As directed    In 24 hours. Okay to wash  wound with warm soapy water. Avoid scrubbing the wound. Pat dry.   Remove dressing in 24 hours   Complete by: As directed      Allergies as of 01/28/2019      Reactions   Penicillins Other (See Comments)   Childhood allergy  Did it involve swelling of the face/tongue/throat, SOB, or low BP? Unknown Did it involve sudden or severe rash/hives, skin peeling, or any reaction on the inside of your mouth or nose? Unknown Did you need to seek medical attention at a hospital or doctor's office? Unknown When did it last happen?childhood  If all above answers are "NO", may proceed with cephalosporin use.   Chlorhexidine Gluconate Rash      Medication List    STOP taking these medications   azithromycin 250 MG tablet Commonly known as: ZITHROMAX   levETIRAcetam 500 MG tablet Commonly known as: KEPPRA     TAKE these medications   aspirin EC 325 MG tablet Take 325 mg by mouth daily.   atorvastatin 20 MG tablet Commonly known as: LIPITOR Take 20 mg by mouth daily.   clopidogrel 75 MG tablet Commonly known as: PLAVIX Take 75 mg by mouth daily. Starting 01-04-19   lisinopril 10 MG tablet Commonly known as: ZESTRIL Take 10 mg by mouth at bedtime.   metFORMIN 500 MG 24 hr tablet Commonly known as: GLUCOPHAGE-XR Take 1,000 mg by mouth 2 (two) times daily.      Follow-up Information  Consuella Lose, MD. Schedule an appointment as soon as possible for a visit in 3 week(s).   Specialty: Neurosurgery Contact information: [address] [address] [place] [address] [phone]           Signed: Traci Sermon 01/28/2019, 9:25 AM

## 2019-01-28 NOTE — Progress Notes (Signed)
Pt ambulated in the unit hall (approx. 570 ft) without any problems. Pt has voided this morning as well. RN reviewed discharge instructions with pt and his sister was on the phone (speaker phone) listening as well. All questions answered at this time, pt understands that he will call to schedule a f/u appt with Dr. Kathyrn Sheriff within 3 weeks and he cannot drive until given clearance by the MD. All pt belongings returned to pt, including his cell phone, electronic devices, clothing, shoes, glasses, and partial upper plate. All PIVs removed as well as the arterial line. Pt has been given his morning ASA and 75mg  of plavix. Pt to be discharged home and his sister is currently waiting in her car at the main entrance to drive him there. RN was happy to participate in the care of this pt.

## 2019-01-28 NOTE — Progress Notes (Signed)
  NEUROSURGERY PROGRESS NOTE   No issues overnight.  No concerns this am Feels well, eager for d/c  EXAM:  BP (!) 126/91   Pulse 62   Temp 98.2 F (36.8 C) (Oral)   Resp (!) 21   Ht 5\' 11"  (1.803 m)   Wt 97.1 kg   SpO2 97%   BMI 29.85 kg/m   Awake, alert, oriented  Speech fluent, appropriate  CN grossly intact  5/5 BUE/BLE  Right groin access site: c/d/i, no swelling  IMPRESSION/PLAN 63 y.o. male POD #1 left ACOM pipeline. Doing well. - Ambulate - D/C home

## 2021-04-22 IMAGING — DX PORTABLE CHEST - 1 VIEW
1 series · 1 of 1 positions shown · non-contrast
Comparison: March 30, 2017

CLINICAL DATA: Fever

EXAM:
PORTABLE CHEST 1 VIEW

[chest ap]
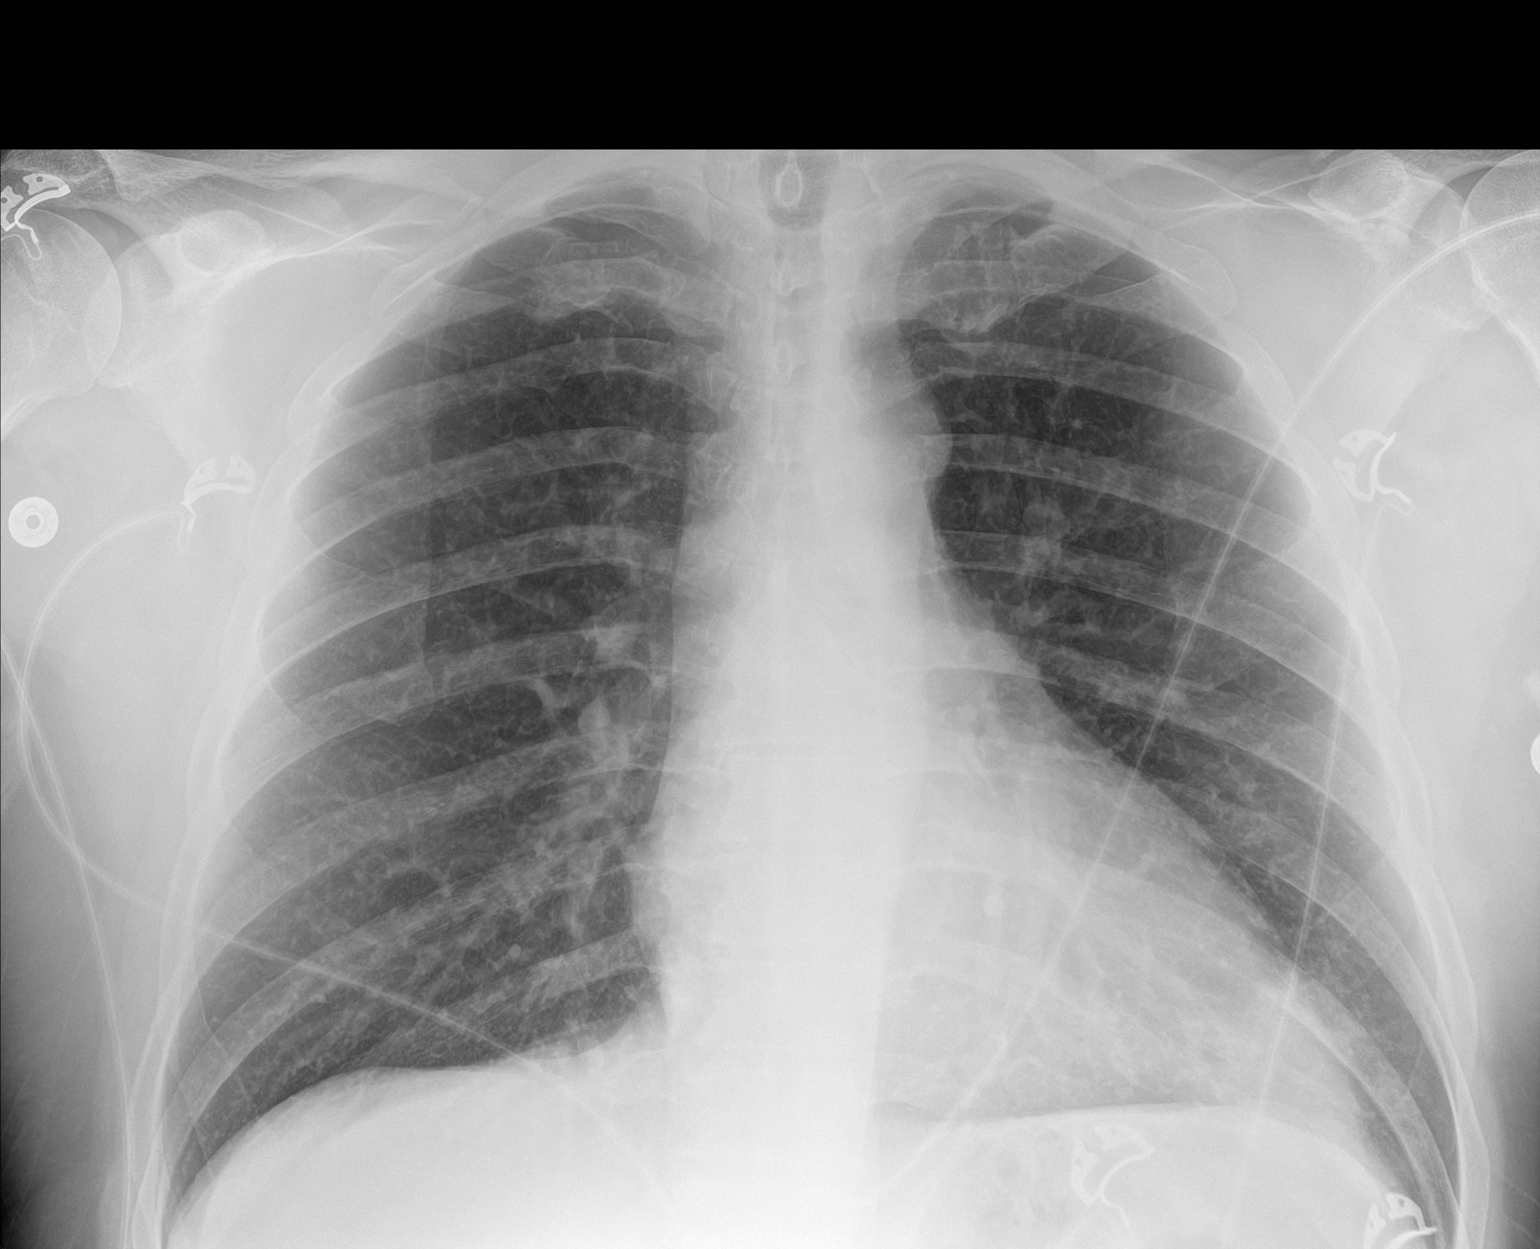

[1 of 1 positions shown; findings below may reference images not displayed]

FINDINGS: There is a focal area of airspace consolidation in the left base.
Lungs elsewhere clear. Heart size and pulmonary vascularity are
normal. No adenopathy. No bone lesions.
IMPRESSION: Focal area of airspace consolidation consistent with pneumonia left
base. Lungs elsewhere clear. No adenopathy. Cardiac silhouette
within normal limits.

Followup PA and lateral chest radiographs recommended in 3-4 weeks
following trial of antibiotic therapy to ensure resolution and
exclude underlying malignancy.

## 2021-11-19 ENCOUNTER — Other Ambulatory Visit: Payer: Self-pay | Admitting: Physician Assistant

## 2021-11-19 DIAGNOSIS — Z122 Encounter for screening for malignant neoplasm of respiratory organs: Secondary | ICD-10-CM

## 2021-11-19 DIAGNOSIS — F17200 Nicotine dependence, unspecified, uncomplicated: Secondary | ICD-10-CM

## 2021-12-06 ENCOUNTER — Ambulatory Visit
Admission: RE | Admit: 2021-12-06 | Discharge: 2021-12-06 | Disposition: A | Discharge status: Home / Self Care | Payer: 59 | Source: Ambulatory Visit | Attending: Physician Assistant | Admitting: Physician Assistant

## 2021-12-06 DIAGNOSIS — F17200 Nicotine dependence, unspecified, uncomplicated: Secondary | ICD-10-CM

## 2021-12-06 DIAGNOSIS — Z122 Encounter for screening for malignant neoplasm of respiratory organs: Secondary | ICD-10-CM

## 2022-01-02 ENCOUNTER — Encounter: Payer: Self-pay | Admitting: *Deleted

## 2022-11-27 ENCOUNTER — Other Ambulatory Visit: Payer: Self-pay | Admitting: Family Medicine

## 2022-11-27 DIAGNOSIS — J439 Emphysema, unspecified: Secondary | ICD-10-CM

## 2022-11-27 DIAGNOSIS — F17210 Nicotine dependence, cigarettes, uncomplicated: Secondary | ICD-10-CM

## 2022-11-28 ENCOUNTER — Other Ambulatory Visit: Payer: Self-pay | Admitting: Family Medicine

## 2022-11-28 DIAGNOSIS — F17210 Nicotine dependence, cigarettes, uncomplicated: Secondary | ICD-10-CM

## 2022-12-09 ENCOUNTER — Ambulatory Visit
Admission: RE | Admit: 2022-12-09 | Discharge: 2022-12-09 | Disposition: A | Discharge status: Home / Self Care | Payer: 59 | Source: Ambulatory Visit | Attending: Family Medicine | Admitting: Family Medicine

## 2022-12-09 DIAGNOSIS — J439 Emphysema, unspecified: Secondary | ICD-10-CM

## 2022-12-09 DIAGNOSIS — F17210 Nicotine dependence, cigarettes, uncomplicated: Secondary | ICD-10-CM

## 2023-11-06 ENCOUNTER — Emergency Department (HOSPITAL_BASED_OUTPATIENT_CLINIC_OR_DEPARTMENT_OTHER)

## 2023-11-06 ENCOUNTER — Encounter (HOSPITAL_BASED_OUTPATIENT_CLINIC_OR_DEPARTMENT_OTHER): Payer: Self-pay

## 2023-11-06 ENCOUNTER — Emergency Department (HOSPITAL_BASED_OUTPATIENT_CLINIC_OR_DEPARTMENT_OTHER)
Admission: EM | Admit: 2023-11-06 | Discharge: 2023-11-06 | Disposition: A | Discharge status: Home / Self Care | Attending: Emergency Medicine | Admitting: Emergency Medicine

## 2023-11-06 ENCOUNTER — Other Ambulatory Visit: Payer: Self-pay

## 2023-11-06 ENCOUNTER — Emergency Department (HOSPITAL_BASED_OUTPATIENT_CLINIC_OR_DEPARTMENT_OTHER): Admitting: Radiology

## 2023-11-06 DIAGNOSIS — R0781 Pleurodynia: Secondary | ICD-10-CM | POA: Diagnosis present

## 2023-11-06 DIAGNOSIS — Z5329 Procedure and treatment not carried out because of patient's decision for other reasons: Secondary | ICD-10-CM | POA: Insufficient documentation

## 2023-11-06 DIAGNOSIS — R079 Chest pain, unspecified: Secondary | ICD-10-CM

## 2023-11-06 DIAGNOSIS — D72829 Elevated white blood cell count, unspecified: Secondary | ICD-10-CM | POA: Diagnosis not present

## 2023-11-06 DIAGNOSIS — R0602 Shortness of breath: Secondary | ICD-10-CM | POA: Insufficient documentation

## 2023-11-06 DIAGNOSIS — Z7902 Long term (current) use of antithrombotics/antiplatelets: Secondary | ICD-10-CM | POA: Diagnosis not present

## 2023-11-06 DIAGNOSIS — Z7982 Long term (current) use of aspirin: Secondary | ICD-10-CM | POA: Diagnosis not present

## 2023-11-06 LAB — CBC
HCT: 46.4 % (ref 39.0–52.0)
Hemoglobin: 14.9 g/dL (ref 13.0–17.0)
MCH: 28.3 pg (ref 26.0–34.0)
MCHC: 32.1 g/dL (ref 30.0–36.0)
MCV: 88 fL (ref 80.0–100.0)
Platelets: 366 10*3/uL (ref 150–400)
RBC: 5.27 MIL/uL (ref 4.22–5.81)
RDW: 14.1 % (ref 11.5–15.5)
WBC: 12.3 10*3/uL — ABNORMAL HIGH (ref 4.0–10.5)
nRBC: 0 % (ref 0.0–0.2)

## 2023-11-06 LAB — BASIC METABOLIC PANEL WITH GFR
Anion gap: 13 (ref 5–15)
BUN: 12 mg/dL (ref 8–23)
CO2: 26 mmol/L (ref 22–32)
Calcium: 10.3 mg/dL (ref 8.9–10.3)
Chloride: 96 mmol/L — ABNORMAL LOW (ref 98–111)
Creatinine, Ser: 0.76 mg/dL (ref 0.61–1.24)
GFR, Estimated: >60 mL/min (ref >60)
Glucose, Bld: 280 mg/dL — ABNORMAL HIGH (ref 70–99)
Potassium: 4 mmol/L (ref 3.5–5.1)
Sodium: 135 mmol/L (ref 135–145)

## 2023-11-06 MED ORDER — IOHEXOL 350 MG/ML SOLN
100.0000 mL | Freq: Once | INTRAVENOUS | Status: AC | PRN
  Administered 2023-11-06: 75 mL via INTRAVENOUS

## 2023-11-06 NOTE — ED Provider Notes (Signed)
 [place] EMERGENCY DEPARTMENT AT [facility] Provider Note   CSN: [id number] Arrival date & time: 11/06/23  1721     History  Chief Complaint  Patient presents with   Shortness of Breath    Kobyn Kray is a 68 y.o. male.  HPI   68 year old male presents emergency department with concern for cough and right-sided pleuritic chest pain.  Over this past month patient has been battling a left-sided pneumonia.  He is completed 2 antibiotic regimens along with steroids and symptomatic treatment.  He was feeling improved until this morning when he woke up.  He started having coughing and sharp right sided mid rib pain with each cough and deep inspiration.  Sputum is clear/white.  Denies any hemoptysis.  No active chest pain at rest.  Denies any swelling of his legs.  No previous history of DVT/PE.  Referred here from primary/urgent care for CT imaging and further evaluation.  Home Medications Prior to Admission medications   Medication Sig Start Date End Date Taking? Authorizing Provider  aspirin  EC 325 MG tablet Take 325 mg by mouth daily. 12/28/18   [provider]  atorvastatin  (LIPITOR) 20 MG tablet Take 20 mg by mouth daily.  08/19/18   [provider]  clopidogrel  (PLAVIX ) 75 MG tablet Take 75 mg by mouth daily. Starting 01-04-19    [provider]  lisinopril  (PRINIVIL ,ZESTRIL ) 10 MG tablet Take 10 mg by mouth at bedtime.     [provider]  metFORMIN  (GLUCOPHAGE -XR) 500 MG 24 hr tablet Take 1,000 mg by mouth 2 (two) times daily.    [provider]      Allergies    Penicillins and Chlorhexidine  gluconate    Review of Systems   Review of Systems  Constitutional:  Positive for fatigue. Negative for fever.  Respiratory:  Positive for cough. Negative for shortness of breath.   Cardiovascular:  Positive for chest pain. Negative for palpitations and leg swelling.  Gastrointestinal:  Negative for abdominal pain, diarrhea and  vomiting.  Skin:  Negative for rash.  Neurological:  Negative for headaches.    Physical Exam Updated Vital Signs BP 101/85   Pulse 87   Temp 98.2 F (36.8 C)   Resp 15   Ht 5\' 11"  (1.803 m)   Wt 86.6 kg   SpO2 99%   BMI 26.64 kg/m  Physical Exam Vitals and nursing note reviewed.  Constitutional:      General: He is not in acute distress.    Appearance: Normal appearance.  HENT:     Head: Normocephalic.     Mouth/Throat:     Mouth: Mucous membranes are moist.  Cardiovascular:     Rate and Rhythm: Normal rate.  Pulmonary:     Effort: Pulmonary effort is normal. No respiratory distress.     Breath sounds: Examination of the right-lower field reveals decreased breath sounds. Examination of the left-lower field reveals decreased breath sounds. Decreased breath sounds present. No rales.  Abdominal:     Palpations: Abdomen is soft.     Tenderness: There is no abdominal tenderness.  Skin:    General: Skin is warm.  Neurological:     Mental Status: He is alert and oriented to person, place, and time. Mental status is at baseline.  Psychiatric:        Mood and Affect: Mood normal.     ED Results / Procedures / Treatments   Labs (all labs ordered are listed, but only abnormal results are displayed)  Labs Reviewed  BASIC METABOLIC PANEL WITH GFR - Abnormal; Notable for the following components:      Result Value   Chloride 96 (*)    Glucose, Bld 280 (*)    All other components within normal limits  CBC - Abnormal; Notable for the following components:   WBC 12.3 (*)    All other components within normal limits    EKG EKG Interpretation Date/Time:  Friday Nov 06 2023 17:40:39 EDT Ventricular Rate:  88 PR Interval:  142 QRS Duration:  84 QT Interval:  348 QTC Calculation: 421 R Axis:   77  Text Interpretation: Normal sinus rhythm Normal ECG When compared with ECG of 08-Jan-2019 17:21, PREVIOUS ECG IS PRESENT Confirmed by Florentino Hurdle [phone]) on 11/06/2023 7:36:20  PM  Radiology DG Chest 2 View Result Date: 11/06/2023 CLINICAL DATA:  Dyspnea, right-sided chest pain EXAM: CHEST - 2 VIEW COMPARISON:  01/08/2019, 03/30/2017 FINDINGS: The heart size and mediastinal contours are within normal limits. Nodular density at the left lung base is in keeping with a nipple shadow. There is development of asymmetric pleural thickening and nodular opacity at the left apex suspicious for developing apical mass. No pneumothorax or pleural effusion. The visualized skeletal structures are unremarkable; no erosive changes identified. IMPRESSION: 1. Interval development of asymmetric pleural thickening and nodular opacity at the left apex suspicious for developing apical mass. This could be further assessed with contrast enhanced CT imaging. Electronically Signed   By: Worthy Heads M.D.   On: 11/06/2023 19:47    Procedures Procedures    Medications Ordered in ED Medications  iohexol  (OMNIPAQUE ) 350 MG/ML injection 100 mL (75 mLs Intravenous Contrast Given 11/06/23 2037)    ED Course/ Medical Decision Making/ A&P                                 Medical Decision Making Amount and/or Complexity of Data Reviewed Labs: ordered. Radiology: ordered.  Risk Prescription drug management.   68 year old male presents emergency department with concern for ongoing cough and chest pain.  Recently treated for pneumonia with oral antibiotics, steroid taper and symptomatic treatment.  Patient was tachypneic on arrival but otherwise normal vitals.  No acute respiratory distress.  Chest x-ray shows concern for ongoing findings in the left upper lung.  Blood work shows a mild leukocytosis.  CT angio of the chest shows no PE but shows concerning findings of atypical infection in the left apical lung, possible cavitary lesion, lymphadenopathy as well as a right lung nodule.  Cancer is in the differential as well as fungal infection and atypical infection/TB.  When I went to discuss the  results with the patient and recommend admission for IV antibiotics and more emergent workup he declines.  He states that he is starving and he needs to leave to go get food.  He mentions that he will possibly check in tomorrow to then be admitted.  I explained to the patient the urgency of the situation.  We have offered him every type of food that we have at this facility and he declines.  He is requesting to leave AGAINST MEDICAL ADVICE.  I have recommended that the patient stays in the department for the completion of their workup however they decline.  I have expressed the importance of staying including the risks of leaving which include worsening condition, permanent disability and death.  Patient accepts these risks.  They are alert and  oriented, they have capacity to make this decision.  I have not been able to convince the patient to stay and they understand the risks of leaving AGAINST MEDICAL ADVICE.          Final Clinical Impression(s) / ED Diagnoses Final diagnoses:  None    Rx / DC Orders ED Discharge Orders     None         Flonnie Humphrey, DO 11/06/23 2234

## 2023-11-06 NOTE — ED Notes (Signed)
 Pt requesting food. States "I got to go, I'm starving, I need to eat something before everything closes" RN offered pt food that is available in the ED with Diet Shasta. Pt refused food, drank shasta. MD aware of pt concern of wanting to leave d/t being hungry.

## 2023-11-06 NOTE — ED Notes (Signed)
 Pt requesting to leave. EDMD at bedside. Explained the risks of leaving AMA that can lead to worsening conditions and even death. Pt verbalized understanding and signed AMA form. PIV removed by pt. Assisted with covering site with gauze and tape. Pt out of ED with steady gait not in visible distress.

## 2023-11-06 NOTE — ED Triage Notes (Signed)
 Patient arrives ambulatory to the ED with complaints of worsening suspected pneumonia.  Patient has been "sick" for one month. He has taken several doses of antibiotics after being diagnosed with pneumonia. Patient reports a dull pain on the right side when inhaling.  Sent here to rule out a blood clot in lungs.

## 2023-11-06 NOTE — Discharge Instructions (Signed)
 You have chosen to leave the emergency department AGAINST MEDICAL ADVICE.  I have explained to you your testing results and the need for further evaluation/admission.  You have verbalized understanding of these results and plan and are choosing to leave the hospital AGAINST MEDICAL ADVICE. You understand the risks associated with leaving without further evaluation/treatment. If you have any worsening symptoms or choose to be treated please return immediately to emergency department.  Your CT chest findings were concerning for ongoing infection in the left upper lung which appeared atypical.  This would require IV antibiotics and further evaluation.  There is also note of nodule and swollen lymph nodes.  This could be secondary to infection or concerning for a possible mass/cancer.  It was recommended that you stay in the hospital for further workup and IV antibiotics which you have declined.

## 2023-11-06 NOTE — ED Notes (Signed)
 Pt returned from CT. Tolerated well. Placed back on monitor. A/Ox4, RR equal and unlabored. Requesting something to eat. Informed pt we need to wait for CT results before he can eat or drink. Pt verbalized understanding.

## 2023-11-09 ENCOUNTER — Encounter (HOSPITAL_COMMUNITY): Payer: Self-pay

## 2023-11-09 ENCOUNTER — Emergency Department (HOSPITAL_COMMUNITY)
Admission: EM | Admit: 2023-11-09 | Discharge: 2023-11-10 | Disposition: A | Discharge status: Home / Self Care | Attending: Emergency Medicine | Admitting: Emergency Medicine

## 2023-11-09 ENCOUNTER — Other Ambulatory Visit: Payer: Self-pay

## 2023-11-09 ENCOUNTER — Emergency Department (HOSPITAL_COMMUNITY)

## 2023-11-09 DIAGNOSIS — R918 Other nonspecific abnormal finding of lung field: Secondary | ICD-10-CM | POA: Insufficient documentation

## 2023-11-09 DIAGNOSIS — Z7901 Long term (current) use of anticoagulants: Secondary | ICD-10-CM | POA: Insufficient documentation

## 2023-11-09 DIAGNOSIS — Z7982 Long term (current) use of aspirin: Secondary | ICD-10-CM | POA: Diagnosis not present

## 2023-11-09 DIAGNOSIS — R058 Other specified cough: Secondary | ICD-10-CM | POA: Insufficient documentation

## 2023-11-09 DIAGNOSIS — R079 Chest pain, unspecified: Secondary | ICD-10-CM | POA: Diagnosis present

## 2023-11-09 LAB — CBC
HCT: 42.2 % (ref 39.0–52.0)
Hemoglobin: 13.4 g/dL (ref 13.0–17.0)
MCH: 28.3 pg (ref 26.0–34.0)
MCHC: 31.8 g/dL (ref 30.0–36.0)
MCV: 89.2 fL (ref 80.0–100.0)
Platelets: 283 10*3/uL (ref 150–400)
RBC: 4.73 MIL/uL (ref 4.22–5.81)
RDW: 14 % (ref 11.5–15.5)
WBC: 9.4 10*3/uL (ref 4.0–10.5)
nRBC: 0 % (ref 0.0–0.2)

## 2023-11-09 LAB — BASIC METABOLIC PANEL WITH GFR
Anion gap: 11 (ref 5–15)
BUN: 9 mg/dL (ref 8–23)
CO2: 22 mmol/L (ref 22–32)
Calcium: 9.1 mg/dL (ref 8.9–10.3)
Chloride: 100 mmol/L (ref 98–111)
Creatinine, Ser: 0.76 mg/dL (ref 0.61–1.24)
GFR, Estimated: >60 mL/min (ref >60)
Glucose, Bld: 390 mg/dL — ABNORMAL HIGH (ref 70–99)
Potassium: 4.2 mmol/L (ref 3.5–5.1)
Sodium: 133 mmol/L — ABNORMAL LOW (ref 135–145)

## 2023-11-09 NOTE — ED Provider Triage Note (Signed)
 Emergency Medicine Provider Triage Evaluation Note  Larone Kliethermes , a 68 y.o. male  was evaluated in triage.  Pt complains of seen and evaluated at [facility] 3 days ago, diagnosed with spiculated/atypical pneumonia on CT, "1. No evidence of significant pulmonary embolus. 2. Developing cavitary spiculated nodules in the left upper lung with left apical consolidation and cavitation. Right middle lung nodule. These lesions may represent primary metastatic neoplasm, necrotizing pneumonia, or atypical infectious process such as TB or fungal infection. 3. Mediastinal and right hilar lymphadenopathy is suspicious for metastatic disease but could represent inflammatory lymph nodes. 4. Aortic atherosclerosis."  Recommended admission at that time but patient declined, back now for admission.  Review of Systems  Positive: Shob, pneumonia vs cancer vs tb Negative:   Physical Exam  BP 116/75   Pulse 85   Temp 99 F (37.2 C)   Resp 18   Ht 5\' 11"  (1.803 m)   Wt 86.6 kg   SpO2 96%   BMI 26.64 kg/m  Gen:   Awake, no distress   Resp:  Normal effort  MSK:   Moves extremities without difficulty  Other:    Medical Decision Making  Medically screening exam initiated at 5:19 PM.  Appropriate orders placed.  Michaeljohn Biss was informed that the remainder of the evaluation will be completed by another provider, this initial triage assessment does not replace that evaluation, and the importance of remaining in the ED until their evaluation is complete.  Workup initiated in triage Likely did not need admission based on previous evaluation in the emergency department for atypical infiltrate versus new cancer.   Nelly Banco, [place] 11/09/23 1719

## 2023-11-09 NOTE — ED Triage Notes (Signed)
 Pt came in via POV d/t last Friday he was told he had Pneumonia 3 days ago. He was told that he needed to be admitted & was going to get transferred here via [facility]. Pt opted to leave & bring himself back here to ED 3 days later for the IV/meds he was told that he needed for admission while he was at [facility]. A/Ox4, denies SOB, endorses some intermittent pain under Rt breast area when he takes a deep breath.

## 2023-11-10 ENCOUNTER — Telehealth: Payer: Self-pay | Admitting: Physician Assistant

## 2023-11-10 DIAGNOSIS — R918 Other nonspecific abnormal finding of lung field: Secondary | ICD-10-CM | POA: Diagnosis not present

## 2023-11-10 MED ORDER — AZITHROMYCIN 250 MG PO TABS
500.0000 mg | ORAL_TABLET | Freq: Once | ORAL | Status: AC
  Administered 2023-11-10: 500 mg via ORAL
  Filled 2023-11-10: qty 2

## 2023-11-10 MED ORDER — LEVOFLOXACIN 750 MG PO TABS
750.0000 mg | ORAL_TABLET | Freq: Once | ORAL | Status: AC
  Administered 2023-11-10: 750 mg via ORAL
  Filled 2023-11-10: qty 1

## 2023-11-10 MED ORDER — LEVOFLOXACIN 500 MG PO TABS: 500.0000 mg | ORAL_TABLET | Freq: Every day | ORAL | 0 refills | Status: AC

## 2023-11-10 MED ORDER — AZITHROMYCIN 250 MG PO TABS: 250.0000 mg | ORAL_TABLET | Freq: Every day | ORAL | 0 refills | Status: AC

## 2023-11-10 NOTE — ED Provider Notes (Signed)
 [place] EMERGENCY DEPARTMENT AT [facility] Provider Note   CSN: [id number] Arrival date & time: 11/09/23  1540     History  Chief Complaint  Patient presents with   Pneumonia    Alfred Marshall is a 68 y.o. male.  Started feeling unwell similar to episodes of pneumonia in the past on may 2. Seen at [facility], XR showed LUL PNA, started doxy, no improvement went back on the 11th. Xr looked worse, started levaquin. Started feeling better with that and was back to doing relatively normal things but on may30 patient with right sided chest pain and malodorous cough returning. Called pcp who referred him to [facility] who referred him to ED. Seen at [facility], CTA w/o PE but with abnormal PNA vs mass. Recommended admit for further workup however he couldn't stay at that time. Felt a bit better over the weekend, but this morning talked to PCP who recommended he come back to the hospital. Has been here for nearly 12 hours without complaint.         Home Medications Prior to Admission medications   Medication Sig Start Date End Date Taking? Authorizing Provider  azithromycin  (ZITHROMAX ) 250 MG tablet Take 1 tablet (250 mg total) by mouth daily. Take 1 every day until finished. 11/10/23  Yes Spring San, Reymundo Caulk, MD  levofloxacin (LEVAQUIN) 500 MG tablet Take 1 tablet (500 mg total) by mouth daily. 11/10/23  Yes Keiji Melland, Reymundo Caulk, MD  aspirin  EC 325 MG tablet Take 325 mg by mouth daily. 12/28/18   [provider]  atorvastatin  (LIPITOR) 20 MG tablet Take 20 mg by mouth daily.  08/19/18   [provider]  clopidogrel  (PLAVIX ) 75 MG tablet Take 75 mg by mouth daily. Starting 01-04-19    [provider]  lisinopril  (PRINIVIL ,ZESTRIL ) 10 MG tablet Take 10 mg by mouth at bedtime.     [provider]  metFORMIN  (GLUCOPHAGE -XR) 500 MG 24 hr tablet Take 1,000 mg by mouth 2 (two) times daily.    [provider]      Allergies    Penicillins and Chlorhexidine  gluconate     Review of Systems   Review of Systems  Physical Exam Updated Vital Signs BP 117/75 (BP Location: Right Arm)   Pulse 80   Temp 98.9 F (37.2 C)   Resp 20   Ht 5\' 11"  (1.803 m)   Wt 86.6 kg   SpO2 98%   BMI 26.64 kg/m  Physical Exam Vitals and nursing note reviewed.  Constitutional:      Appearance: He is well-developed.  HENT:     Head: Normocephalic and atraumatic.  Cardiovascular:     Rate and Rhythm: Normal rate.  Pulmonary:     Effort: Pulmonary effort is normal. No respiratory distress.  Abdominal:     General: There is no distension.  Musculoskeletal:        General: Normal range of motion.     Cervical back: Normal range of motion.  Skin:    General: Skin is warm and dry.  Neurological:     Mental Status: He is alert.     ED Results / Procedures / Treatments   Labs (all labs ordered are listed, but only abnormal results are displayed) Labs Reviewed  BASIC METABOLIC PANEL WITH GFR - Abnormal; Notable for the following components:      Result Value   Sodium 133 (*)    Glucose, Bld 390 (*)    All other components within normal limits  CBC  EKG None  Radiology DG Chest 1 View Result Date: 11/09/2023 CLINICAL DATA:  141880 SOB (shortness of breath) [id number] EXAM: CHEST  1 VIEW COMPARISON:  Cxr 11/06/23, ct angio chest 11/06/23 FINDINGS: The heart and mediastinal contours are unchanged. Left upper lobe mass and cavitary lesion better evaluated on CT angiography chest 11/06/2023. No pulmonary edema. No pleural effusion. No pneumothorax. No acute osseous abnormality. IMPRESSION: 1. Left upper lobe mass and cavitary lesion better evaluated on CT angiography chest 11/06/2023. 2. No change in chest x-ray. Electronically Signed   By: Morgane  Naveau M.D.   On: 11/09/2023 19:02    Procedures Procedures    Medications Ordered in ED Medications  levofloxacin (LEVAQUIN) tablet 750 mg (750 mg Oral Given 11/10/23 0334)  azithromycin  (ZITHROMAX ) tablet 500 mg (500 mg  Oral Given 11/10/23 0334)    ED Course/ Medical Decision Making/ A&P                                 Medical Decision Making  Reviewed xr's and ct from previously. Concern for post-obstructive pneumonia but vitals are all normal and he appears well. D/w Pulm, Dr. Charon Copper, who agrees with me that patient does not need admitted but likely needs a bronchoscopy. She will pass on to outpatient team to get it schedculed. Recommended Levaquin/Azithro for time being. Patient ok with plan.    Final Clinical Impression(s) / ED Diagnoses Final diagnoses:  Lung mass    Rx / DC Orders ED Discharge Orders          Ordered    azithromycin  (ZITHROMAX ) 250 MG tablet  Daily        11/10/23 0330    levofloxacin (LEVAQUIN) 500 MG tablet  Daily        11/10/23 0330              Kayona Foor, Reymundo Caulk, MD 11/10/23 [phone]

## 2023-11-10 NOTE — Telephone Encounter (Signed)
 Patient scheduled.

## 2023-12-25 ENCOUNTER — Encounter: Payer: Self-pay | Admitting: Family Medicine

## 2023-12-25 DIAGNOSIS — F17210 Nicotine dependence, cigarettes, uncomplicated: Secondary | ICD-10-CM

## 2023-12-28 ENCOUNTER — Other Ambulatory Visit: Payer: Self-pay | Admitting: Family Medicine

## 2023-12-28 DIAGNOSIS — F17210 Nicotine dependence, cigarettes, uncomplicated: Secondary | ICD-10-CM

## 2024-01-01 ENCOUNTER — Ambulatory Visit (INDEPENDENT_AMBULATORY_CARE_PROVIDER_SITE_OTHER): Admitting: Emergency Medicine

## 2024-01-01 ENCOUNTER — Encounter: Payer: Self-pay | Admitting: Emergency Medicine

## 2024-01-01 VITALS — BP 138/82 | Ht 71.0 in | Wt 191.0 lb

## 2024-01-01 DIAGNOSIS — J189 Pneumonia, unspecified organism: Secondary | ICD-10-CM | POA: Diagnosis not present

## 2024-01-01 DIAGNOSIS — F17210 Nicotine dependence, cigarettes, uncomplicated: Secondary | ICD-10-CM | POA: Diagnosis not present

## 2024-01-01 DIAGNOSIS — R918 Other nonspecific abnormal finding of lung field: Secondary | ICD-10-CM

## 2024-01-01 DIAGNOSIS — Z720 Tobacco use: Secondary | ICD-10-CM | POA: Insufficient documentation

## 2024-01-01 NOTE — Patient Instructions (Signed)
 VISIT SUMMARY:  Today, we discussed your ongoing respiratory symptoms following your treatment for pneumonia. We reviewed your recent medical history, including your treatments and test results. We also talked about your smoking habits and recent lab results showing elevated glucose levels.  YOUR PLAN:  -PNEUMONIA: Pneumonia is an infection that inflames the air sacs in one or both lungs. Your symptoms have partially improved with antibiotics, but a recent CT scan showed some concerning findings. We will order a repeat chest CT as soon as possible to check for any changes. If the CT findings persist or worsen, we may need to perform a bronchoscopy, which is a procedure to look inside your lungs. Please schedule a follow-up appointment to review the CT results.  -TOBACCO USE: Smoking can contribute to your cough and overall lung health. We recommend reducing or quitting smoking to help improve your symptoms and overall health.  INSTRUCTIONS:  Please schedule a follow-up appointment to review the results of your repeat chest CT. If your symptoms worsen or you experience any new symptoms, contact our office immediately.

## 2024-01-01 NOTE — Progress Notes (Signed)
 Subjective:    Patient ID: Alfred Marshall, male    DOB: 1955-09-27, 68 y.o.   MRN: [id number]  HPI Alfred Marshall is a 68 year old male who presents with persistent respiratory symptoms following treatment for pneumonia. He was referred by a pulmonary specialist at [facility] for follow-up of his respiratory condition.  He began experiencing pleuritic chest discomfort and cough in early May. Initially, he sought care at urgent care on May 2nd, where he was given prednisone for what was thought to be an upper respiratory infection. By May 11th, his symptoms worsened, and a chest x-ray confirmed pneumonia. He was treated with doxycycline until May 16th, but his condition did not improve.  On May 16th, he returned to urgent care and was prescribed levofloxacin  750 mg, which led to temporary improvement. However, by May 30th, his symptoms, including a bad taste and productive cough, returned. He sought care at the emergency department, where a CT scan was performed. He was prescribed levofloxacin  500 mg and erythromycin in early June, which led to improvement in his symptoms.  As of late July, he feels tired, with occasional cough and bad taste, but no hemoptysis. He continues to smoke less than a pack a day.  A CT-PA of the chest was performed on May 30th. A CBC on July 18th showed a white count of 8.3, hemoglobin 14.7, and platelets 394, with no left shift. The comprehensive metabolic panel was mostly normal except for elevated potassium and glucose.   LABS   CBC: WBC 8.3, Hb 14.7, PLT 394 (12/25/2023)   Comprehensive Metabolic Panel: Potassium 5.4 (elevated), Glucose 176 (elevated) (12/25/2023)    RADIOLOGY   CT-PA Chest: No pulmonary embolism, mediastinal lymphadenopathy in bilateral hilar regions, new right middle lobe nodule 1.1 cm, two cavitary nodules in left upper lobe, largest 2.6 x 2.7 cm, consolidation and cavitary change extending to left apex (11/06/2023)   Review of  Systems As per HPI   Past Medical History:  Diagnosis Date   Asthma    as a child   Diabetes mellitus without complication (HCC)    Pneumonia    Subarachnoid hemorrhage (HCC)     Family History  Problem Relation Age of Onset   Congestive Heart Failure Father     - Father: deceased due to lung cancer - Mother: diabetes  Social History   Socioeconomic History   Marital status: Divorced    Spouse name: Not on file   Number of children: Not on file   Years of education: Not on file   Highest education level: Not on file  Occupational History   Not on file  Tobacco Use   Smoking status: Every Day    Current packs/day: 0.25    Types: Cigarettes   Smokeless tobacco: Never  Vaping Use   Vaping status: Never Used  Substance and Sexual Activity   Alcohol use: Not Currently   Drug use: Never   Sexual activity: Not on file  Other Topics Concern   Not on file  Social History Narrative   Not on file   Social Drivers of Health   Financial Resource Strain: Not on file  Food Insecurity: Low Risk  (12/25/2023)   Received from [facility]   Hunger Vital Sign    Within the past 12 months, you worried that your food would run out before you got money to buy more: Never true    Within the past 12 months, the food you bought just didn't last  and you didn't have money to get more. : Never true  Transportation Needs: No Transportation Needs (12/25/2023)   Received from [facility]    In the past 12 months, has lack of reliable transportation kept you from medical appointments, meetings, work or from getting things needed for daily living? : No  Physical Activity: Not on file  Stress: Not on file  Social Connections: Not on file  Intimate Partner Violence: Not on file    Allergies  Allergen Reactions   Penicillins Other (See Comments)    Childhood allergy  Did it involve swelling of the face/tongue/throat, SOB, or low BP? Unknown Did it involve sudden or  severe rash/hives, skin peeling, or any reaction on the inside of your mouth or nose? Unknown Did you need to seek medical attention at a hospital or doctor's office? Unknown When did it last happen?      childhood  If all above answers are "NO", may proceed with cephalosporin use.    Chlorhexidine  Gluconate Rash    Current Outpatient Medications on File Prior to Visit  Medication Sig Dispense Refill   atorvastatin  (LIPITOR) 20 MG tablet Take 20 mg by mouth daily.      azithromycin  (ZITHROMAX ) 250 MG tablet Take 1 tablet (250 mg total) by mouth daily. Take 1 every day until finished. 4 tablet 0   clopidogrel  (PLAVIX ) 75 MG tablet Take 75 mg by mouth daily. Starting 01-04-19     levofloxacin  (LEVAQUIN ) 500 MG tablet Take 1 tablet (500 mg total) by mouth daily. 5 tablet 0   lisinopril  (PRINIVIL ,ZESTRIL ) 10 MG tablet Take 10 mg by mouth at bedtime.      metFORMIN  (GLUCOPHAGE -XR) 500 MG 24 hr tablet Take 1,000 mg by mouth 2 (two) times daily.     No current facility-administered medications on file prior to visit.          Objective:   Physical Exam  Vitals:   01/01/24 1040  BP: 138/82  SpO2: 97%  Weight: 191 lb (86.6 kg)  Height: 5' 11 (1.803 m)    Gen: Pleasant, well-nourished, in no distress,  normal affect  ENT: No lesions,  mouth clear,  oropharynx clear, no postnasal drip  Neck: No JVD, no stridor  Lungs: No use of accessory muscles, no crackles or wheezing on normal respiration, no wheeze on forced expiration  Cardiovascular: RRR, heart sounds normal, no murmur or gallops, no peripheral edema  Musculoskeletal: No deformities, no cyanosis or clubbing  Neuro: alert, awake, non focal  Skin: Warm, no lesions or rash      Assessment & Plan:   Pneumonia involving left lung Pneumonia Persistent symptoms with pleuritic chest discomfort and cough. Partial improvement with antibiotics. CT-PA showed mediastinal lymphadenopathy, new right middle lobe nodule, and  cavitary nodules in left upper lobe. Possible incompletely treated pneumonia or other conditions like inflammatory processes or lung cancer. - Order repeat chest CT ASAP. - Consider bronchoscopy if CT findings persist or worsen. - Schedule follow-up appointment to review CT results.   Tobacco use Tobacco use Continues smoking less than a pack a day, contributing to cough. Discussed cessation, will revisit at next OV   Lamar Chris, MD, PhD 01/01/2024, 12:05 PM  Pulmonary and Critical Care [phone] or if no answer before 7:00PM call [phone] For any issues after 7:00PM please call eLink [phone]

## 2024-01-01 NOTE — Assessment & Plan Note (Signed)
 Tobacco use Continues smoking less than a pack a day, contributing to cough. Discussed cessation, will revisit at next OV

## 2024-01-01 NOTE — Assessment & Plan Note (Signed)
 Pneumonia Persistent symptoms with pleuritic chest discomfort and cough. Partial improvement with antibiotics. CT-PA showed mediastinal lymphadenopathy, new right middle lobe nodule, and cavitary nodules in left upper lobe. Possible incompletely treated pneumonia or other conditions like inflammatory processes or lung cancer. - Order repeat chest CT ASAP. - Consider bronchoscopy if CT findings persist or worsen. - Schedule follow-up appointment to review CT results.

## 2024-01-01 NOTE — Progress Notes (Signed)
 Talking  save me

## 2024-01-06 LAB — QUANTIFERON-TB GOLD PLUS
Mitogen-NIL: 9.07 [IU]/mL
NIL: 0.03 [IU]/mL
QuantiFERON-TB Gold Plus: NEGATIVE
TB1-NIL: <0 [IU]/mL
TB2-NIL: <0 [IU]/mL

## 2024-01-08 ENCOUNTER — Ambulatory Visit
Admission: RE | Admit: 2024-01-08 | Discharge: 2024-01-08 | Disposition: A | Discharge status: Home / Self Care | Source: Ambulatory Visit | Attending: Emergency Medicine | Admitting: Emergency Medicine

## 2024-01-08 DIAGNOSIS — R918 Other nonspecific abnormal finding of lung field: Secondary | ICD-10-CM

## 2024-01-13 ENCOUNTER — Ambulatory Visit (INDEPENDENT_AMBULATORY_CARE_PROVIDER_SITE_OTHER): Admitting: Emergency Medicine

## 2024-01-13 ENCOUNTER — Encounter: Payer: Self-pay | Admitting: Emergency Medicine

## 2024-01-13 VITALS — BP 120/74 | HR 69 | Temp 98.3°F | Ht 71.0 in | Wt 194.4 lb

## 2024-01-13 DIAGNOSIS — F17210 Nicotine dependence, cigarettes, uncomplicated: Secondary | ICD-10-CM | POA: Diagnosis not present

## 2024-01-13 DIAGNOSIS — J189 Pneumonia, unspecified organism: Secondary | ICD-10-CM | POA: Diagnosis not present

## 2024-01-13 DIAGNOSIS — Z720 Tobacco use: Secondary | ICD-10-CM

## 2024-01-13 NOTE — Patient Instructions (Addendum)
 We will plan to repeat your CT scan of the chest in February 2026. We will perform pulmonary function testing Continue to work on decreasing your cigarettes. Continue to slowly increase your activity level. Please follow with Dr. Shelah in February after your CT so we can review those results together. Please call or message our office if you develop any new breathing or respiratory symptoms so we can troubleshoot at that time.

## 2024-01-13 NOTE — Assessment & Plan Note (Signed)
 Improving left upper lobe density with less cavitation, smaller overall.  He is clinically improved, still with some cough but no purulent sputum, no hemoptysis.  I think we can hold off on any further antibiotic treatment and continue to do radiographical surveillance.  I suspect this area will scar down but need to rule out any evidence for possible malignancy hidden in the infiltrate.  We will repeat his scan in February 2026

## 2024-01-13 NOTE — Assessment & Plan Note (Signed)
 Ordered pulmonary function testing to assess his degree of obstruction.  Hold off on BD therapy for now.  We did discuss cessation and he is interested in trying to stop.

## 2024-01-13 NOTE — Progress Notes (Signed)
   Subjective:    Patient ID: Alfred Marshall, male    DOB: September 17, 1955, 68 y.o.   MRN: [id number]  HPI  ROV 01/13/2024 --68 year old gentleman with a tobacco use who was treated for pneumonia in May.  He had a CT-PA 5/30 that showed no pulmonary embolism but mediastinal adenopathy had a new right middle lobe nodule 1.1 cm, 2 cavitary nodules in the left upper lobe 2.6 x 2.7 cm.  We repeated his CT scan of the chest to assess risk for other causes including possible malignancy. He does feel better. Not really feeling SOB, he is having intermittent cough with some mucous - white mucous. He hasn't really gotten back to his usual stamina, but he has been able to work. He has cut his smoking down a lot.   Super D CT chest 01/08/2024 reviewed by me, shows no mediastinal or hilar adenopathy.  Diffuse bilateral bronchial wall thickening, decreased size and suspicion in cavitary appearing complex consolidation in left upper lobe, now 5.4 x 2.2 cm.  Another area is decreased to 2.7 x 1.8 cm.  There are some new consolidation and regular groundglass inferiorly.  The anterior inferior right upper lobe pulmonary nodule has resolved   Review of Systems As per HPI       Objective:   Physical Exam  Vitals:   01/13/24 1127  BP: 120/74  Pulse: 69  Temp: 98.3 F (36.8 C)  SpO2: 96%  Weight: 194 lb 6.4 oz (88.2 kg)  Height: 5' 11 (1.803 m)    Gen: Pleasant, well-nourished, in no distress,  normal affect  ENT: No lesions,  mouth clear,  oropharynx clear, no postnasal drip  Neck: No JVD, no stridor  Lungs: No use of accessory muscles, no crackles or wheezing on normal respiration, no wheeze on forced expiration  Cardiovascular: RRR, heart sounds normal, no murmur or gallops, no peripheral edema  Musculoskeletal: No deformities, no cyanosis or clubbing  Neuro: alert, awake, non focal  Skin: Warm, no lesions or rash      Assessment & Plan:   Pneumonia involving left lung Improving left upper  lobe density with less cavitation, smaller overall.  He is clinically improved, still with some cough but no purulent sputum, no hemoptysis.  I think we can hold off on any further antibiotic treatment and continue to do radiographical surveillance.  I suspect this area will scar down but need to rule out any evidence for possible malignancy hidden in the infiltrate.  We will repeat his scan in February 2026  Tobacco use Ordered pulmonary function testing to assess his degree of obstruction.  Hold off on BD therapy for now.  We did discuss cessation and he is interested in trying to stop.  Time spent 31 minutes  Lamar Chris, MD, PhD 01/13/2024, 5:09 PM [facility] [phone] or if no answer before 7:00PM call [phone] For any issues after 7:00PM please call eLink [phone]
